# Patient Record
Sex: Male | Born: 1960 | Race: White | Hispanic: No | Marital: Married | Smoking: Current every day smoker
Health system: Southern US, Community
[De-identification: ages and names within clinical notes are randomized; demographics above are authoritative.]

## PROBLEM LIST (undated history)

## (undated) DIAGNOSIS — I10 Essential (primary) hypertension: Secondary | ICD-10-CM

---

## 2019-09-06 LAB — COLOGUARD: COLOGUARD: NEGATIVE

## 2020-10-07 ENCOUNTER — Ambulatory Visit
Admission: RE | Admit: 2020-10-07 | Discharge: 2020-10-07 | Disposition: A | Discharge status: Home / Self Care | Payer: BC Managed Care – PPO | Source: Ambulatory Visit | Attending: Internal Medicine | Admitting: Internal Medicine

## 2020-10-07 ENCOUNTER — Observation Stay
Admission: EM | Admit: 2020-10-07 | Discharge: 2020-10-08 | Disposition: A | Discharge status: Home / Self Care | Payer: BC Managed Care – PPO | Attending: Internal Medicine | Admitting: Internal Medicine

## 2020-10-07 ENCOUNTER — Other Ambulatory Visit: Payer: Self-pay

## 2020-10-07 ENCOUNTER — Observation Stay: Payer: BC Managed Care – PPO

## 2020-10-07 ENCOUNTER — Other Ambulatory Visit: Payer: Self-pay | Admitting: Internal Medicine

## 2020-10-07 ENCOUNTER — Emergency Department: Payer: BC Managed Care – PPO

## 2020-10-07 DIAGNOSIS — G459 Transient cerebral ischemic attack, unspecified: Secondary | ICD-10-CM | POA: Insufficient documentation

## 2020-10-07 DIAGNOSIS — Z720 Tobacco use: Secondary | ICD-10-CM

## 2020-10-07 DIAGNOSIS — R200 Anesthesia of skin: Secondary | ICD-10-CM | POA: Diagnosis present

## 2020-10-07 DIAGNOSIS — F17200 Nicotine dependence, unspecified, uncomplicated: Secondary | ICD-10-CM

## 2020-10-07 DIAGNOSIS — Z20822 Contact with and (suspected) exposure to covid-19: Secondary | ICD-10-CM | POA: Diagnosis not present

## 2020-10-07 DIAGNOSIS — I10 Essential (primary) hypertension: Secondary | ICD-10-CM | POA: Insufficient documentation

## 2020-10-07 DIAGNOSIS — I639 Cerebral infarction, unspecified: Principal | ICD-10-CM | POA: Diagnosis present

## 2020-10-07 DIAGNOSIS — E785 Hyperlipidemia, unspecified: Secondary | ICD-10-CM

## 2020-10-07 DIAGNOSIS — F17210 Nicotine dependence, cigarettes, uncomplicated: Secondary | ICD-10-CM | POA: Diagnosis not present

## 2020-10-07 HISTORY — DX: Essential (primary) hypertension: I10

## 2020-10-07 LAB — COMPREHENSIVE METABOLIC PANEL
ALT: 30 U/L (ref 0–44)
AST: 22 U/L (ref 15–41)
Albumin: 4.6 g/dL (ref 3.5–5.0)
Alkaline Phosphatase: 51 U/L (ref 38–126)
Anion gap: 11 (ref 5–15)
BUN: 13 mg/dL (ref 8–23)
CO2: 23 mmol/L (ref 22–32)
Calcium: 9.4 mg/dL (ref 8.9–10.3)
Chloride: 103 mmol/L (ref 98–111)
Creatinine, Ser: 1.13 mg/dL (ref 0.61–1.24)
GFR, Estimated: >60 mL/min (ref >60)
Glucose, Bld: 101 mg/dL — ABNORMAL HIGH (ref 70–99)
Potassium: 4 mmol/L (ref 3.5–5.1)
Sodium: 137 mmol/L (ref 135–145)
Total Bilirubin: 1.3 mg/dL — ABNORMAL HIGH (ref 0.3–1.2)
Total Protein: 7.9 g/dL (ref 6.5–8.1)

## 2020-10-07 LAB — CBC
HCT: 51.6 % (ref 39.0–52.0)
Hemoglobin: 17.5 g/dL — ABNORMAL HIGH (ref 13.0–17.0)
MCH: 31.8 pg (ref 26.0–34.0)
MCHC: 33.9 g/dL (ref 30.0–36.0)
MCV: 93.6 fL (ref 80.0–100.0)
Platelets: 249 10*3/uL (ref 150–400)
RBC: 5.51 MIL/uL (ref 4.22–5.81)
RDW: 11.9 % (ref 11.5–15.5)
WBC: 7.5 10*3/uL (ref 4.0–10.5)
nRBC: 0 % (ref 0.0–0.2)

## 2020-10-07 LAB — DIFFERENTIAL
Abs Immature Granulocytes: 0.02 10*3/uL (ref 0.00–0.07)
Basophils Absolute: 0.1 10*3/uL (ref 0.0–0.1)
Basophils Relative: 2 %
Eosinophils Absolute: 0.2 10*3/uL (ref 0.0–0.5)
Eosinophils Relative: 3 %
Immature Granulocytes: 0 %
Lymphocytes Relative: 30 %
Lymphs Abs: 2.2 10*3/uL (ref 0.7–4.0)
Monocytes Absolute: 0.6 10*3/uL (ref 0.1–1.0)
Monocytes Relative: 8 %
Neutro Abs: 4.3 10*3/uL (ref 1.7–7.7)
Neutrophils Relative %: 57 %

## 2020-10-07 LAB — RESP PANEL BY RT-PCR (FLU A&B, COVID) ARPGX2
Influenza A by PCR: NEGATIVE
Influenza B by PCR: NEGATIVE
SARS Coronavirus 2 by RT PCR: NEGATIVE

## 2020-10-07 LAB — APTT: aPTT: 29 seconds (ref 24–36)

## 2020-10-07 LAB — PROTIME-INR
INR: 0.9 (ref 0.8–1.2)
Prothrombin Time: 12.5 seconds (ref 11.4–15.2)

## 2020-10-07 MED ORDER — ACETAMINOPHEN 325 MG PO TABS: 650.0000 mg | ORAL_TABLET | ORAL | Status: DC | PRN

## 2020-10-07 MED ORDER — ATORVASTATIN CALCIUM 20 MG PO TABS
40.0000 mg | ORAL_TABLET | Freq: Every day | ORAL | Status: DC
  Administered 2020-10-07 – 2020-10-08 (×2): 40 mg via ORAL
  Filled 2020-10-07 (×2): qty 2

## 2020-10-07 MED ORDER — ACETAMINOPHEN 160 MG/5ML PO SOLN
650.0000 mg | ORAL | Status: DC | PRN
  Filled 2020-10-07: qty 20.3

## 2020-10-07 MED ORDER — ASPIRIN EC 81 MG PO TBEC
81.0000 mg | DELAYED_RELEASE_TABLET | Freq: Every day | ORAL | Status: DC
  Administered 2020-10-07 – 2020-10-08 (×2): 81 mg via ORAL
  Filled 2020-10-07 (×2): qty 1

## 2020-10-07 MED ORDER — STROKE: EARLY STAGES OF RECOVERY BOOK: Freq: Once | Status: AC

## 2020-10-07 MED ORDER — SENNOSIDES-DOCUSATE SODIUM 8.6-50 MG PO TABS: 1.0000 | ORAL_TABLET | Freq: Every evening | ORAL | Status: DC | PRN

## 2020-10-07 MED ORDER — NICOTINE 21 MG/24HR TD PT24
21.0000 mg | MEDICATED_PATCH | Freq: Every day | TRANSDERMAL | Status: DC
  Administered 2020-10-07 – 2020-10-08 (×2): 21 mg via TRANSDERMAL
  Filled 2020-10-07 (×2): qty 1

## 2020-10-07 MED ORDER — SODIUM CHLORIDE 0.9% FLUSH: 3.0000 mL | Freq: Once | INTRAVENOUS | Status: DC

## 2020-10-07 MED ORDER — ENOXAPARIN SODIUM 40 MG/0.4ML IJ SOSY
40.0000 mg | PREFILLED_SYRINGE | INTRAMUSCULAR | Status: DC
  Administered 2020-10-07: 20:00:00 40 mg via SUBCUTANEOUS
  Filled 2020-10-07: qty 0.4

## 2020-10-07 MED ORDER — ACETAMINOPHEN 650 MG RE SUPP: 650.0000 mg | RECTAL | Status: DC | PRN

## 2020-10-07 NOTE — ED Notes (Signed)
Zach RN aware of assigned bed 

## 2020-10-07 NOTE — ED Provider Notes (Signed)
[facility] Emergency Department Provider Note ____________________________________________   Event Date/Time   First MD Initiated Contact with Patient 10/07/20 1455     (approximate)  I have reviewed the triage vital signs and the nursing notes.   HISTORY  Chief Complaint Weakness    HPI Haley Roza is a 60 y.o. male reports he is quite healthy except is a smoker  He week ago on Thursday was at the doctor's office to have earwax removed when he was in the waiting room he noticed that he felt like his left arm went, numb and felt a little weak and then the left side of his face felt similar along with his tongue  He went home and he reports the next day when he got up all the numbness in his arm and everything else is gone away except his left side of his tongue just feels little numb and that the only symptom has had for a week.  Saw his doctor today and reported that, had a head CT and was told he had a stroke and to come to the emergency room  No headache no fevers or chills no chest pain.  He does not have a history of high blood pressure.  I asked him what his blood pressure here today and he reports when he was at his doctor's office earlier it was completely normal, I checked Dr.'s note and indeed his blood pressure was normotensive earlier   Past Medical History:  Diagnosis Date  . Hypertension     Patient Active Problem List   Diagnosis Date Noted  . CVA (cerebral vascular accident) (HCC) 10/07/2020    History reviewed. No pertinent surgical history.  Prior to Admission medications   Not on File    Allergies Patient has no known allergies.  History reviewed. No pertinent family history.  Social History Social History   Tobacco Use  . Smoking status: Current Every Day Smoker    Packs/day: 1.00    Types: Cigarettes  . Smokeless tobacco: Never Used    Review of Systems Constitutional: No fever/chills Eyes: No visual  changes. ENT: No sore throat.  Left-sided tongue feels little numb Cardiovascular: Denies chest pain. Respiratory: Denies shortness of breath. Gastrointestinal: No abdominal pain.   Genitourinary: Negative for dysuria. Musculoskeletal: Negative for back pain. Skin: Negative for rash. Neurological: Negative for headaches, areas of focal weakness or numbness except as noted in HPI.    ____________________________________________   PHYSICAL EXAM:  VITAL SIGNS: ED Triage Vitals  Enc Vitals Group     BP 10/07/20 1310 (!) 171/146     Pulse Rate 10/07/20 1310 94     Resp 10/07/20 1310 20     Temp 10/07/20 1309 97.7 F (36.5 C)     Temp Source 10/07/20 1309 Oral     SpO2 10/07/20 1310 97 %     Weight 10/07/20 1314 169 lb (76.7 kg)     Height 10/07/20 1314 5\' 9"  (1.753 m)     Head Circumference --      Peak Flow --      Pain Score 10/07/20 1313 0     Pain Loc --      Pain Edu? --      Excl. in GC? --     Constitutional: Alert and oriented. Well appearing and in no acute distress.  Ambulatory without difficulty. Eyes: Conjunctivae are normal. Head: Atraumatic. Nose: No congestion/rhinnorhea. Mouth/Throat: Mucous membranes are moist. Neck: No stridor.  No  carotid bruits noted. Cardiovascular: Normal rate, regular rhythm. Grossly normal heart sounds.  Good peripheral circulation. Respiratory: Normal respiratory effort.  No retractions. Lungs CTAB. Gastrointestinal: Soft and nontender. No distention. Musculoskeletal: No lower extremity tenderness nor edema. Neurologic:  Normal speech and language. No gross focal neurologic deficits are appreciated.  No noted focal neurologic deficits.  Strength is normal in all extremities.  Reports normal sensation all extremities.  Clear speech.  Normal orientation.  I did not discretely test sensation on his tongue however.  Extraocular movements are normal and no visual field deficits. Skin:  Skin is warm, dry and intact. No rash  noted. Psychiatric: Mood and affect are normal. Speech and behavior are normal.  ____________________________________________   LABS (all labs ordered are listed, but only abnormal results are displayed)  Labs Reviewed  CBC - Abnormal; Notable for the following components:      Result Value   Hemoglobin 17.5 (*)    All other components within normal limits  COMPREHENSIVE METABOLIC PANEL - Abnormal; Notable for the following components:   Glucose, Bld 101 (*)    Total Bilirubin 1.3 (*)    All other components within normal limits  RESP PANEL BY RT-PCR (FLU A&B, COVID) ARPGX2  DIFFERENTIAL  PROTIME-INR  APTT  HIV ANTIBODY (ROUTINE TESTING W REFLEX)  HEMOGLOBIN A1C  LIPID PANEL  MAGNESIUM  PHOSPHORUS  BASIC METABOLIC PANEL  CBC  TSH   ____________________________________________  EKG  ED ECG REPORT I, Sharyn Creamer, the attending physician, personally viewed and interpreted this ECG.  Date: 10/07/2020 EKG Time: 1320 Rate: 75 Rhythm: normal sinus rhythm QRS Axis: normal Intervals: normal ST/T Wave abnormalities: normal Narrative Interpretation: no evidence of acute ischemia  ____________________________________________  RADIOLOGY  CT HEAD WO CONTRAST  Result Date: 10/07/2020 CLINICAL DATA:  Pins and needle sensation down left arm last week. EXAM: CT HEAD WITHOUT CONTRAST TECHNIQUE: Contiguous axial images were obtained from the base of the skull through the vertex without intravenous contrast. COMPARISON:  None. FINDINGS: Brain: Chest age indeterminate infarct in the right parieto-occipital region with hypoattenuation and loss of gray-white differentiation.Remote appearing infarct in the right caudate and adjacent anterior limb of the internal capsule. No acute hemorrhage. No hydrocephalus. No midline shift. No visible extra-axial fluid collection. Basal cisterns are patent. Additional mild patchy white matter hypoattenuation, most likely related to chronic microvascular  ischemic disease. Vascular: No hyperdense vessel identified. Calcific atherosclerosis. Skull: No acute fracture. Sinuses/Orbits: Opacified posterior ethmoid air cells, partially imaged. Otherwise, visualized sinuses are clear. Other: Small right mastoid effusion. IMPRESSION: 1. Age indeterminate infarct in the right parieto-occipital region, potentially early subacute given the patient's reported symptoms. No substantial mass effect or acute hemorrhage. Recommend MRI to better evaluate for acute ischemia. 2. Remote appearing infarct in the right caudate and adjacent anterior limb of the internal capsule. 3. Small right mastoid effusion and opacified posterior ethmoid air cell. These results will be called to the ordering clinician or representative by the Radiologist Assistant, and communication documented in the PACS or [employer]. Electronically Signed   By: Feliberto Harts MD   On: 10/07/2020 11:55   US Carotid Duplex Bilateral  Result Date: 10/07/2020 CLINICAL DATA:  60 year old male with stroke. EXAM: BILATERAL CAROTID DUPLEX ULTRASOUND TECHNIQUE: Wallace Cullens scale imaging, color Doppler and duplex ultrasound were performed of bilateral carotid and vertebral arteries in the neck. COMPARISON:  None. FINDINGS: Criteria: Quantification of carotid stenosis is based on velocity parameters that correlate the residual internal carotid diameter with NASCET-based stenosis levels,  using the diameter of the distal internal carotid lumen as the denominator for stenosis measurement. The following velocity measurements were obtained: RIGHT ICA: Peak systolic velocity 79 cm/sec, End diastolic velocity 31 cm/sec CCA: Peak systolic velocity 52 cm/sec SYSTOLIC ICA/CCA RATIO:  0.5 ECA: Peak systolic velocity 79 cm/sec LEFT ICA: Peak systolic velocity 82 cm/sec, End diastolic velocity 34 cm/sec CCA: 7 cm/sec SYSTOLIC ICA/CCA RATIO:  1.2 ECA: 64 cm/sec RIGHT CAROTID ARTERY: Mild multifocal atherosclerotic plaque formation,  most prominent at the carotid bulb and bifurcation. No significant tortuosity. Normal low resistance waveforms. RIGHT VERTEBRAL ARTERY:  Antegrade flow. LEFT CAROTID ARTERY: Mild multifocal atherosclerotic plaque formation, most prominent in the distal common carotid artery and carotid bulb. No significant tortuosity. Normal low resistance waveforms. LEFT VERTEBRAL ARTERY:  Antegrade flow. Upper extremity non-invasive blood pressures: Not obtained. IMPRESSION: 1. Right carotid artery system: Less than 50% stenosis secondary to mild multifocal atherosclerotic plaque formation. 2. Left carotid artery system: Less than 50% stenosis secondary to mild multifocal atherosclerotic plaque formation. 3.  Vertebral artery system: Patent with antegrade flow bilaterally. Marliss Coots, MD [facility] Radiology Electronically Signed   By: Marliss Coots MD   On: 10/07/2020 16:26   Imaging reviewed, age-indeterminate infarct right parieto-occipital region.  No hemorrhage.  Additionally remote appearing infarcts also noted on the right.   ____________________________________________   PROCEDURES  Procedure(s) performed: None  Procedures  Critical Care performed: No  ____________________________________________   INITIAL IMPRESSION / ASSESSMENT AND PLAN / ED COURSE  Pertinent labs & imaging results that were available during my care of the patient were reviewed by me and considered in my medical decision making (see chart for details).   Patient presents for rather abrupt onset of acute neurologic symptoms a week ago, they have largely resolved except for a slight change in sensation of the left side of his tongue.  Outpatient physician referred him for concerns of an acute stroke.  His imaging does seem concerning for a subacute infarct at this time, symptoms and last known well 1 week ago.  Clearly not a tPA candidate  He is alert well oriented, patient tells  me that he would actually really like to be able to go to [place] tomorrow and potentially return for further work-up if that is possible.  I discussed with him, and I will have Dr. Selina Cooley our neurologist see him today for further treatment recommendations as well as coordination of appropriate work-up.  It is unclear to me at this point if he would actually benefit from inpatient evaluation given the longevity of symptoms.  Patient is now taking a baby aspirin daily.  Clinical Course as of 10/07/20 1728  Thu Oct 07, 2020  1728 ad[place]ission discussed with Dr. Tyson Babinski [MQ]    Clinical Course User Index [MQ] Sharyn Creamer, MD    ----------------------------------------- 4:02 PM on 10/07/2020 -----------------------------------------  Discussed case with Dr. Selina Cooley neurologist.  She recommends patient be admitted for further work-up, and has discussed this with him.  Patient is understanding agreeable plan for admission for work-up for apparent stroke, Dr. Selina Cooley to enter additional work-up and orders  Admission discussed with hospitalist Dr. Tyson Babinski ____________________________________________   FINAL CLINICAL IMPRESSION(S) / ED DIAGNOSES  Final diagnoses:  Cerebrovascular accident (CVA), unspecified mechanism (HCC)        Note:  This document was prepared using Dragon voice recognition software and may include unintentional dictation errors       Sharyn Creamer, MD 10/07/20 1728

## 2020-10-07 NOTE — H&P (Addendum)
[facility] History and Physical  Chick Cousins ONG:[id number] DOB: [birth year] DOA: 10/07/2020  Referring physician: ED  PCP: Gracelyn Nurse, MD   Patient is coming from: Home  Chief Complaint: Numbness of the tongue  HPI: Lonnie Larson is a 60 y.o. male with no documented past medical history, supposed to be starting antihypertensive medicine presented to hospital with complaints of left side of the tongue numbness.  Of note patient had gone to his primary care physician for some wax in his ears and then  had left upper extremity numbness including the left face.  That all resolved in 1 hour but then he persisted to have numbness over the tongue for the last 1 week.  He again saw his doctor today and had a CT scan done and was asked to come to the hospital.  Patient denies any focal weakness, nausea, headache, dizziness, blurry vision.  Patient denies any dizziness, lightheadedness or syncope.  Denies any pain, shortness of breath, cough, fever or chills.  Denies any urinary urgency, frequency or dysuria.  Patient has normal appetite and denies any changes in his bowel habits.  ED Course: In the ED, patient any focal neurological deficits.  EKG showed normal sinus rhythm.  CT scan done showed age-indeterminate infarct in the right parietal occipital region potentially early subacute.  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  Neurology was consulted who recommended work-up for stroke so hospitalist team was consulted for admission to the hospital.  Review of Systems:  All systems were reviewed and were negative unless otherwise mentioned in the HPI  Past Medical History:  Diagnosis Date  . Hypertension    History reviewed. No pertinent surgical history.  Social History:  reports that he has been smoking cigarettes. He has been smoking about 1.00 pack per day. He has never used smokeless tobacco. No history on file  for alcohol use and drug use.  No Known Allergies  History reviewed. No pertinent family history.   Prior to Admission medications   Not on File    Physical Exam: Vitals:   10/07/20 1309 10/07/20 1310 10/07/20 1314  BP:  (!) 171/146   Pulse:  94   Resp:  20   Temp: 97.7 F (36.5 C)    TempSrc: Oral    SpO2:  97%   Weight:   76.7 kg  Height:   5\' 9"  (1.753 m)   Wt Readings from Last 3 Encounters:  10/07/20 76.7 kg   Body mass index is 24.96 kg/m.  General:  Average built, not in obvious distress HENT: Normocephalic, pupils equally reacting to light and accommodation.  No scleral pallor or icterus noted. Oral mucosa is moist.  Chest:  Clear breath sounds.  Diminished breath sounds bilaterally. No crackles or wheezes.  CVS: S1 &S2 heard. No murmur.  Regular rate and rhythm. Abdomen: Soft, nontender, nondistended.  Bowel sounds are heard.  Liver is not palpable, no abdominal mass palpated Extremities: No cyanosis, clubbing or edema.  Peripheral pulses are palpable. Psych: Alert, awake and oriented, normal mood CNS:  No cranial nerve deficits.  Power equal in all extremities.   No cerebellar signs.   Skin: Warm and dry.  No rashes noted.  Labs on Admission:   CBC: Recent Labs  Lab 10/07/20 1314  WBC 7.5  NEUTROABS 4.3  HGB 17.5*  HCT 51.6  MCV 93.6  PLT 249    Basic Metabolic Panel: Recent Labs  Lab 10/07/20 1314  NA 137  K 4.0  CL 103  CO2 23  GLUCOSE 101*  BUN 13  CREATININE 1.13  CALCIUM 9.4    Liver Function Tests: Recent Labs  Lab 10/07/20 1314  AST 22  ALT 30  ALKPHOS 51  BILITOT 1.3*  PROT 7.9  ALBUMIN 4.6   No results for input(s): LIPASE, AMYLASE in the last 168 hours. No results for input(s): AMMONIA in the last 168 hours.  Cardiac Enzymes: No results for input(s): CKTOTAL, CKMB, CKMBINDEX, TROPONINI in the last 168 hours.  BNP (last 3 results) No results for input(s): BNP in the last 8760 hours.  ProBNP (last 3 results) No  results for input(s): PROBNP in the last 8760 hours.  CBG: No results for input(s): GLUCAP in the last 168 hours.  Lipase  No results found for: LIPASE   Urinalysis No results found for: COLORURINE, APPEARANCEUR, LABSPEC, PHURINE, GLUCOSEU, HGBUR, BILIRUBINUR, KETONESUR, PROTEINUR, UROBILINOGEN, NITRITE, LEUKOCYTESUR   Drugs of Abuse  No results found for: LABOPIA, COCAINSCRNUR, LABBENZ, AMPHETMU, THCU, LABBARB    Radiological Exams on Admission: CT HEAD WO CONTRAST  Result Date: 10/07/2020 CLINICAL DATA:  Pins and needle sensation down left arm last week. EXAM: CT HEAD WITHOUT CONTRAST TECHNIQUE: Contiguous axial images were obtained from the base of the skull through the vertex without intravenous contrast. COMPARISON:  None. FINDINGS: Brain: Chest age indeterminate infarct in the right parieto-occipital region with hypoattenuation and loss of gray-white differentiation.Remote appearing infarct in the right caudate and adjacent anterior limb of the internal capsule. No acute hemorrhage. No hydrocephalus. No midline shift. No visible extra-axial fluid collection. Basal cisterns are patent. Additional mild patchy white matter hypoattenuation, most likely related to chronic microvascular ischemic disease. Vascular: No hyperdense vessel identified. Calcific atherosclerosis. Skull: No acute fracture. Sinuses/Orbits: Opacified posterior ethmoid air cells, partially imaged. Otherwise, visualized sinuses are clear. Other: Small right mastoid effusion. IMPRESSION: 1. Age indeterminate infarct in the right parieto-occipital region, potentially early subacute given the patient's reported symptoms. No substantial mass effect or acute hemorrhage. Recommend MRI to better evaluate for acute ischemia. 2. Remote appearing infarct in the right caudate and adjacent anterior limb of the internal capsule. 3. Small right mastoid effusion and opacified posterior ethmoid air cell. These results will be called to the  ordering clinician or representative by the Radiologist Assistant, and communication documented in the PACS or [employer]. Electronically Signed   By: Feliberto Harts MD   On: 10/07/2020 11:55   US Carotid Duplex Bilateral  Result Date: 10/07/2020 CLINICAL DATA:  59 year old male with stroke. EXAM: BILATERAL CAROTID DUPLEX ULTRASOUND TECHNIQUE: Wallace Cullens scale imaging, color Doppler and duplex ultrasound were performed of bilateral carotid and vertebral arteries in the neck. COMPARISON:  None. FINDINGS: Criteria: Quantification of carotid stenosis is based on velocity parameters that correlate the residual internal carotid diameter with NASCET-based stenosis levels, using the diameter of the distal internal carotid lumen as the denominator for stenosis measurement. The following velocity measurements were obtained: RIGHT ICA: Peak systolic velocity 79 cm/sec, End diastolic velocity 31 cm/sec CCA: Peak systolic velocity 52 cm/sec SYSTOLIC ICA/CCA RATIO:  0.5 ECA: Peak systolic velocity 79 cm/sec LEFT ICA: Peak systolic velocity 82 cm/sec, End diastolic velocity 34 cm/sec CCA: 7 cm/sec SYSTOLIC ICA/CCA RATIO:  1.2 ECA: 64 cm/sec RIGHT CAROTID ARTERY: Mild multifocal atherosclerotic plaque formation, most prominent at the carotid bulb and bifurcation. No significant tortuosity. Normal low resistance waveforms. RIGHT VERTEBRAL ARTERY:  Antegrade flow. LEFT CAROTID ARTERY: Mild multifocal  atherosclerotic plaque formation, most prominent in the distal common carotid artery and carotid bulb. No significant tortuosity. Normal low resistance waveforms. LEFT VERTEBRAL ARTERY:  Antegrade flow. Upper extremity non-invasive blood pressures: Not obtained. IMPRESSION: 1. Right carotid artery system: Less than 50% stenosis secondary to mild multifocal atherosclerotic plaque formation. 2. Left carotid artery system: Less than 50% stenosis secondary to mild multifocal atherosclerotic plaque formation. 3.  Vertebral artery  system: Patent with antegrade flow bilaterally. Marliss Coots, MD [facility]iology Electronically Signed   By: Marliss Coots MD   On: 10/07/2020 16:26    EKG: Personally reviewed by me which shows normal sinus rhythm  Assessment/Plan Principal Problem:   CVA (cerebral vascular accident) (HCC)  Subacute CVA with residual tongue numbness.  Neurology was improving the ED who recommended work-up for stroke including carotid duplex ultrasound and echocardiogram.  Patient did have a CT scan which showed subacute infarct.  Check hemoglobin A1c, fasting lipid profile.  Will obtain MRI of the brain for further definition.  Follow neurology recommendations.  PT OT speech therapy.  Telemetry monitoring.  Chronic smoker.  Patient was extensively counseled about it we will put the patient on nicotine patch while in the hospital.   DVT Prophylaxis: Lovenox subcu  Consultant: Neurology  Code Status: Full code  Microbiology none  Antibiotics: None  Family Communication:  Patients' condition and plan of care including tests being ordered have been discussed with the patient and patient's spouse at bedside who indicate understanding and agree with the plan.   Status is: Observation  The patient remains OBS appropriate and will d/c before 2 midnights.  Dispo: The patient is from: Home              Anticipated d/c is to: Home               Severity of Illness: The appropriate patient status for this patient is OBSERVATION. Observation status is judged to be reasonable and necessary in order to provide the required intensity of service to ensure the patient's safety. The patient's presenting symptoms, physical exam findings, and initial radiographic and laboratory data in the context of their medical condition is felt to place them at decreased risk for further clinical deterioration. Furthermore, it is anticipated that the patient will be medically  stable for discharge from the hospital within 2 midnights of admission. The following factors support the patient status of observation. Signed,  Joycelyn Das, MD [facility] 10/07/2020

## 2020-10-07 NOTE — ED Triage Notes (Signed)
Pt reports last Thursday he starting having tingling in left arm and tongue. Had CT today, was called by Dr and told to come to ER because he had a stroke.  States right now he is only having tingling in tongue  Ambulatory, oriented, clear speech

## 2020-10-07 NOTE — ED Notes (Signed)
Hospitalist at bedside 

## 2020-10-07 NOTE — ED Notes (Signed)
US at bedside

## 2020-10-08 ENCOUNTER — Observation Stay (HOSPITAL_BASED_OUTPATIENT_CLINIC_OR_DEPARTMENT_OTHER)
Admit: 2020-10-08 | Discharge: 2020-10-08 | Disposition: A | Discharge status: Home / Self Care | Payer: BC Managed Care – PPO | Attending: Internal Medicine | Admitting: Internal Medicine

## 2020-10-08 DIAGNOSIS — Z720 Tobacco use: Secondary | ICD-10-CM

## 2020-10-08 DIAGNOSIS — E782 Mixed hyperlipidemia: Secondary | ICD-10-CM | POA: Diagnosis not present

## 2020-10-08 DIAGNOSIS — E785 Hyperlipidemia, unspecified: Secondary | ICD-10-CM

## 2020-10-08 DIAGNOSIS — I639 Cerebral infarction, unspecified: Secondary | ICD-10-CM | POA: Diagnosis not present

## 2020-10-08 DIAGNOSIS — I6389 Other cerebral infarction: Secondary | ICD-10-CM | POA: Diagnosis not present

## 2020-10-08 LAB — BASIC METABOLIC PANEL
Anion gap: 9 (ref 5–15)
BUN: 15 mg/dL (ref 8–23)
CO2: 25 mmol/L (ref 22–32)
Calcium: 9 mg/dL (ref 8.9–10.3)
Chloride: 103 mmol/L (ref 98–111)
Creatinine, Ser: 0.91 mg/dL (ref 0.61–1.24)
GFR, Estimated: >60 mL/min (ref >60)
Glucose, Bld: 89 mg/dL (ref 70–99)
Potassium: 3.8 mmol/L (ref 3.5–5.1)
Sodium: 137 mmol/L (ref 135–145)

## 2020-10-08 LAB — CBC
HCT: 48.4 % (ref 39.0–52.0)
Hemoglobin: 16.6 g/dL (ref 13.0–17.0)
MCH: 32.4 pg (ref 26.0–34.0)
MCHC: 34.3 g/dL (ref 30.0–36.0)
MCV: 94.5 fL (ref 80.0–100.0)
Platelets: 236 10*3/uL (ref 150–400)
RBC: 5.12 MIL/uL (ref 4.22–5.81)
RDW: 12 % (ref 11.5–15.5)
WBC: 6.6 10*3/uL (ref 4.0–10.5)
nRBC: 0 % (ref 0.0–0.2)

## 2020-10-08 LAB — ECHOCARDIOGRAM COMPLETE
AR max vel: 2.72 cm2
AV Area VTI: 3.08 cm2
AV Area mean vel: 2.99 cm2
AV Mean grad: 4 mmHg
AV Peak grad: 9.1 mmHg
Ao pk vel: 1.51 m/s
Area-P 1/2: 4.21 cm2
Height: 69 in
MV VTI: 3.48 cm2
S' Lateral: 3.1 cm
Weight: 2704 oz

## 2020-10-08 LAB — HEMOGLOBIN A1C
Hgb A1c MFr Bld: 5.4 % (ref 4.8–5.6)
Mean Plasma Glucose: 108 mg/dL

## 2020-10-08 LAB — LIPID PANEL
Cholesterol: 277 mg/dL — ABNORMAL HIGH (ref 0–200)
HDL: 47 mg/dL (ref >40)
LDL Cholesterol: 210 mg/dL — ABNORMAL HIGH (ref 0–99)
Total CHOL/HDL Ratio: 5.9 RATIO
Triglycerides: 100 mg/dL (ref <150)
VLDL: 20 mg/dL (ref 0–40)

## 2020-10-08 LAB — TSH: TSH: 3.255 u[IU]/mL (ref 0.350–4.500)

## 2020-10-08 LAB — HIV ANTIBODY (ROUTINE TESTING W REFLEX): HIV Screen 4th Generation wRfx: NONREACTIVE

## 2020-10-08 LAB — PHOSPHORUS: Phosphorus: 4.4 mg/dL (ref 2.5–4.6)

## 2020-10-08 LAB — MAGNESIUM: Magnesium: 2.3 mg/dL (ref 1.7–2.4)

## 2020-10-08 MED ORDER — CLOPIDOGREL BISULFATE 75 MG PO TABS: 75.0000 mg | ORAL_TABLET | Freq: Every day | ORAL | 0 refills | Status: AC

## 2020-10-08 MED ORDER — ATORVASTATIN CALCIUM 20 MG PO TABS: 80.0000 mg | ORAL_TABLET | Freq: Every day | ORAL | Status: DC

## 2020-10-08 MED ORDER — ATORVASTATIN CALCIUM 80 MG PO TABS: 80.0000 mg | ORAL_TABLET | Freq: Every day | ORAL | 11 refills | Status: AC

## 2020-10-08 MED ORDER — ASPIRIN 81 MG PO TBEC: 81.0000 mg | DELAYED_RELEASE_TABLET | Freq: Every day | ORAL | 11 refills | Status: AC

## 2020-10-08 MED ORDER — CLOPIDOGREL BISULFATE 75 MG PO TABS
75.0000 mg | ORAL_TABLET | Freq: Every day | ORAL | Status: DC
  Administered 2020-10-08: 13:00:00 75 mg via ORAL
  Filled 2020-10-08: qty 1

## 2020-10-08 NOTE — Evaluation (Signed)
Occupational Therapy Evaluation Patient Details Name: Lonnie Larson MRN: [id number] DOB: [birth year] Today's Date: 10/08/2020    History of Present Illness Pt is 60 y/o male s/p new acute CVA.   Clinical Impression   Pt reports living at home with wife independently at baseline. Pt does not use AD at baseline. He shares IADL tasks with wife and he does the driving. Pt reports stress with getting ready for a trip prior to symptoms of numbness in mouth and on L UE ( has resolved). Pt demonstrates self care tasks and mobility without LOB. OT performed vision assessment as pt report some visual change over the last few weeks. No deficits noted at this time. OT discussed secondary stroke risk education to with and caregiver. All needs within reach. No need for skilled acute OT at this time. OT to SIGN OFF. Thank you for this referral.    Follow Up Recommendations  No OT follow up    Equipment Recommendations  None recommended by OT       Precautions / Restrictions Precautions Precautions: None      Mobility Bed Mobility Overal bed mobility: Independent                  Transfers Overall transfer level: Independent                    Balance Overall balance assessment: Independent                                         ADL either performed or assessed with clinical judgement   ADL Overall ADL's : Independent                                             Vision Patient Visual Report: Other (comment);Blurring of vision ("over the last few weeks") Vision Assessment?: Yes Eye Alignment: Within Functional Limits Ocular Range of Motion: Within Functional Limits Alignment/Gaze Preference: Within Defined Limits Tracking/Visual Pursuits: Able to track stimulus in all quads without difficulty Saccades: Within functional limits Convergence: Within functional limits            Pertinent Vitals/Pain Pain Assessment: No/denies  pain     Hand Dominance Left   Extremity/Trunk Assessment Upper Extremity Assessment Upper Extremity Assessment: Overall WFL for tasks assessed   Lower Extremity Assessment Lower Extremity Assessment: Overall WFL for tasks assessed       Communication Communication Communication: No difficulties   Cognition Arousal/Alertness: Awake/alert Behavior During Therapy: WFL for tasks assessed/performed Overall Cognitive Status: Within Functional Limits for tasks assessed                                                Home Living Family/patient expects to be discharged to:: Private residence Living Arrangements: Spouse/significant other Available Help at Discharge: Family;Available 24 hours/day Type of Home: House Home Access: Stairs to enter [employer] of Steps: 3-4 Entrance Stairs-Rails: Right;Left Home Layout: One level     Bathroom Shower/Tub: Walk-in shower         Home Equipment: None          Prior Functioning/Environment Level of Independence: Independent  Comments: Pt reports I with all aspects of self care and mobility. He lives with wife. Still drives.                 OT Goals(Current goals can be found in the care plan section) Acute Rehab OT Goals Patient Stated Goal: to go home OT Goal Formulation: With patient/family Time For Goal Achievement: 10/08/20 Potential to Achieve Goals: Good  OT Frequency:                AM-PAC OT "6 Clicks" Daily Activity     Outcome Measure Help from another person eating meals?: None Help from another person taking care of personal grooming?: None Help from another person toileting, which includes using toliet, bedpan, or urinal?: None Help from another person bathing (including washing, rinsing, drying)?: None Help from another person to put on and taking off regular upper body clothing?: None Help from another person to put on and taking off regular lower body  clothing?: None 6 Click Score: 24   End of Session Nurse Communication: Mobility status  Activity Tolerance: Patient tolerated treatment well Patient left: in bed;with call bell/phone within reach;with family/visitor present                   Time: 8099-8338 OT Time Calculation (min): 18 min Charges:  OT General Charges $OT Visit: 1 Visit OT Evaluation $OT Eval Low Complexity: 1 Low OT Treatments $Therapeutic Activity: 8-22 mins  Jackquline Denmark, MS, OTR/L , CBIS ascom [phone]  10/08/20, 12:36 PM

## 2020-10-08 NOTE — Progress Notes (Signed)
PT Cancellation Note  Patient Details Name: Lonnie Larson MRN: [id number] DOB: [birth year]   Cancelled Treatment:    Reason Eval/Treat Not Completed: PT screened, no needs identified, will sign off.  Pt resting in bed upon PT arrival; pt reporting only residual symptom is L sided tongue numbness.  Pt independent with bed mobility, transfers, and ambulation 100 feet in room (no AD); no balance deficits noted with functional activities.  Intact B LE strength, light touch, tone, heel to shin coordination, and proprioception.  Pt reporting no mobility, strength, or balance concerns.  PT screen performed--no needs identified; will sign off.  Hendricks Limes, PT 10/08/20, 10:49 AM

## 2020-10-08 NOTE — Discharge Summary (Signed)
Physician Discharge Summary   Lonnie Larson QJF:[id number] DOB: 22-Jun-1960 DOA: 10/07/2020  PCP: Gracelyn Nurse, MD  Admit date: 10/07/2020 Discharge date:  10/08/2020  Admitted From: home Disposition:  home Discharging physician: Lewie Chamber, MD  Recommendations for Outpatient Follow-up:  1. Follow up with neurology 2. Patient discharged on Asa + Plavix x 21 days followed by Jonne Ply monotherapy 3. Patient started on Lipitor 80 mg at discharge; baseline LFTs obtained  4. Patient encouraged to quit smoking, continue encouragement  5. A1c pending at time of discharge    Patient discharged to home in Discharge Condition: stable Risk of unplanned readmission score:    CODE STATUS: Full Diet recommendation:  Diet Orders (From admission, onward)    Start     Ordered   10/08/20 0000  Diet - low sodium heart healthy        10/08/20 1307   10/07/20 1705  Diet Heart Room service appropriate? Yes; Fluid consistency: Thin  Diet effective now       Question Answer Comment  Room service appropriate? Yes   Fluid consistency: Thin      10/07/20 1704          Hospital Course:  Lonnie Larson is a 60 year old male with PMH tobacco use, hypertension (on no home meds) who presented to the hospital with left-sided tongue numbness.  He also had a transient complaint of left arm numbness/tingling that had resolved prior to admission. He was admitted and underwent stroke work-up. CT head showed an age-indeterminate infarct in the right parieto-occipital region as well as remote appearing infarcts involving the right caudate and adjacent anterior limb of the internal capsule.  MRI brain then obtained and showed acute or early subacute infarct involving right occipital lobe with associated edema without mass-effect.  Remote infarcts again seen in the bilateral caudate heads, anterior limb of the right internal capsule, left frontal and right parietal cortex, and right corona radiata.  Carotid  ultrasound showed less than 50% stenosis bilaterally.  Echo was obtained and had no obvious intracardiac clots.  EF 60 to 65%, grade 1 diastolic dysfunction.  No RWMA.  He was evaluated by neurology during hospitalization as well. He was initiated on aspirin and Plavix for 21 days followed by aspirin monotherapy thereafter.  His LDL was 210 and he was discharged on Lipitor 80 mg daily. His A1c was still pending at time of discharge and should be followed up on as well. Neurology follow-up is being arranged at time of discharge as well.  He was strongly encouraged to quit smoking as well which he states he is motivated to try and pursue given his stroke.   The patient's chronic medical conditions were treated accordingly per the patient's home medication regimen except as noted.  On day of discharge, patient was felt deemed stable for discharge. Patient/family member advised to call PCP or come back to ER if needed.   Principal Diagnosis: CVA (cerebral vascular accident) [facility])  Discharge Diagnoses: Active Hospital Problems   Diagnosis Date Noted  . CVA (cerebral vascular accident) (HCC) 10/07/2020    Resolved Hospital Problems  No resolved problems to display.    Discharge Instructions    Ambulatory referral to Neurology   Complete by: As directed    Stroke f/u 4-6 wks   Diet - low sodium heart healthy   Complete by: As directed    Increase activity slowly   Complete by: As directed      Allergies as of 10/08/2020  No Known Allergies     Medication List    TAKE these medications   aspirin 81 MG EC tablet Take 1 tablet (81 mg total) by mouth daily. Swallow whole. Start taking on: Oct 09, 2020   atorvastatin 80 MG tablet Commonly known as: Lipitor Take 1 tablet (80 mg total) by mouth daily.   clopidogrel 75 MG tablet Commonly known as: PLAVIX Take 1 tablet (75 mg total) by mouth daily. Start taking on: Oct 09, 2020       Follow-up Information    Gracelyn Nurse,  MD. Schedule an appointment as soon as possible for a visit in 2 week(s).   Specialty: Internal Medicine Contact information: [address] [place] [address] [phone]              No Known Allergies  Consultations: Neurology  Discharge Exam: BP 133/86 (BP Location: Left Arm)   Pulse 68   Temp 98 F (36.7 C)   Resp 18   Ht  (1.753 m)   Wt 76.7 kg   SpO2 97%   BMI 24.96 kg/m  General appearance: alert, cooperative and no distress Head: Normocephalic, without obvious abnormality, atraumatic Eyes: EOMI, PERRLA Throat: no tongue deviation; symmetric elevation of uvula Lungs: clear to auscultation bilaterally Heart: regular rate and rhythm and S1, S2 normal Abdomen: normal findings: bowel sounds normal and soft, non-tender Extremities: no edema Skin: mobility and turgor normal Neurologic: Strength and sensation intact and normal bilaterally.  Patient endorses subjective left tongue paresthesia.  The results of significant diagnostics from this hospitalization (including imaging, microbiology, ancillary and laboratory) are listed below for reference.   Microbiology: Recent Results (from the past 240 hour(s))  Resp Panel by RT-PCR (Flu A&B, Covid) Nasopharyngeal Swab     Status: None   Collection Time: 10/07/20  4:30 PM   Specimen: Nasopharyngeal Swab; Nasopharyngeal(NP) swabs in vial transport medium  Result Value Ref Range Status   SARS Coronavirus 2 by RT PCR NEGATIVE NEGATIVE Final    Comment: (NOTE) SARS-CoV-2 target nucleic acids are NOT DETECTED.  The SARS-CoV-2 RNA is generally detectable in upper respiratory specimens during the acute phase of infection. The lowest concentration of SARS-CoV-2 viral copies this assay can detect is 138 copies/mL. A negative result does not preclude SARS-Cov-2 infection and should not be used as the sole basis for treatment or other patient management decisions. A negative result may occur with  improper  specimen collection/handling, submission of specimen other than nasopharyngeal swab, presence of viral mutation(s) within the areas targeted by this assay, and inadequate number of viral copies(<138 copies/mL). A negative result must be combined with clinical observations, patient history, and epidemiological information. The expected result is Negative.  Fact Sheet for Patients:  BloggerCourse.com  Fact Sheet for Healthcare Providers:  SeriousBroker.it  This test is no t yet approved or cleared by the [place] FDA and  has been authorized for detection and/or diagnosis of SARS-CoV-2 by FDA under an Emergency Use Authorization (EUA). This EUA will remain  in effect (meaning this test can be used) for the duration of the COVID-19 declaration under Section 564(b)(1) of the Act, 21 U.S.C.section 360bbb-3(b)(1), unless the authorization is terminated  or revoked sooner.       Influenza A by PCR NEGATIVE NEGATIVE Final   Influenza B by PCR NEGATIVE NEGATIVE Final    Comment: (NOTE) The Xpert Xpress SARS-CoV-2/FLU/RSV plus assay is intended as an aid in the diagnosis of influenza from Nasopharyngeal swab specimens  and should not be used as a sole basis for treatment. Nasal washings and aspirates are unacceptable for Xpert Xpress SARS-CoV-2/FLU/RSV testing.  Fact Sheet for Patients: BloggerCourse.comhttps://www.fda.gov/media/152166/download  Fact Sheet for Healthcare Providers: SeriousBroker.ithttps://www.fda.gov/media/152162/download  This test is not yet approved or cleared by the [place] FDA and has been authorized for detection and/or diagnosis of SARS-CoV-2 by FDA under an Emergency Use Authorization (EUA). This EUA will remain in effect (meaning this test can be used) for the duration of the COVID-19 declaration under Section 564(b)(1) of the Act, 21 U.S.C. section 360bbb-3(b)(1), unless the authorization is terminated or revoked.  Performed at  [facility], [address] [place], [place] [id number]      Labs: BNP (last 3 results) No results for input(s): BNP in the last 8760 hours. Basic Metabolic Panel: Recent Labs  Lab 10/07/20 1314 10/08/20 0531  NA 137 137  K 4.0 3.8  CL 103 103  CO2 23 25  GLUCOSE 101* 89  BUN 13 15  CREATININE 1.13 0.91  CALCIUM 9.4 9.0  MG  --  2.3  PHOS  --  4.4   Liver Function Tests: Recent Labs  Lab 10/07/20 1314  AST 22  ALT 30  ALKPHOS 51  BILITOT 1.3*  PROT 7.9  ALBUMIN 4.6   No results for input(s): LIPASE, AMYLASE in the last 168 hours. No results for input(s): AMMONIA in the last 168 hours. CBC: Recent Labs  Lab 10/07/20 1314 10/08/20 0531  WBC 7.5 6.6  NEUTROABS 4.3  --   HGB 17.5* 16.6  HCT 51.6 48.4  MCV 93.6 94.5  PLT 249 236   Cardiac Enzymes: No results for input(s): CKTOTAL, CKMB, CKMBINDEX, TROPONINI in the last 168 hours. BNP: Invalid input(s): POCBNP CBG: No results for input(s): GLUCAP in the last 168 hours. D-Dimer No results for input(s): DDIMER in the last 72 hours. Hgb A1c No results for input(s): HGBA1C in the last 72 hours. Lipid Profile Recent Labs    10/08/20 0531  CHOL 277*  HDL 47  LDLCALC 210*  TRIG 100  CHOLHDL 5.9   Thyroid function studies Recent Labs    10/08/20 0531  TSH 3.255   Anemia work up No results for input(s): VITAMINB12, FOLATE, FERRITIN, TIBC, IRON, RETICCTPCT in the last 72 hours. Urinalysis No results found for: COLORURINE, APPEARANCEUR, LABSPEC, PHURINE, GLUCOSEU, HGBUR, BILIRUBINUR, KETONESUR, PROTEINUR, UROBILINOGEN, NITRITE, LEUKOCYTESUR Sepsis Labs Invalid input(s): PROCALCITONIN,  WBC,  LACTICIDVEN Microbiology Recent Results (from the past 240 hour(s))  Resp Panel by RT-PCR (Flu A&B, Covid) Nasopharyngeal Swab     Status: None   Collection Time: 10/07/20  4:30 PM   Specimen: Nasopharyngeal Swab; Nasopharyngeal(NP) swabs in vial transport medium  Result Value Ref Range Status    SARS Coronavirus 2 by RT PCR NEGATIVE NEGATIVE Final    Comment: (NOTE) SARS-CoV-2 target nucleic acids are NOT DETECTED.  The SARS-CoV-2 RNA is generally detectable in upper respiratory specimens during the acute phase of infection. The lowest concentration of SARS-CoV-2 viral copies this assay can detect is 138 copies/mL. A negative result does not preclude SARS-Cov-2 infection and should not be used as the sole basis for treatment or other patient management decisions. A negative result may occur with  improper specimen collection/handling, submission of specimen other than nasopharyngeal swab, presence of viral mutation(s) within the areas targeted by this assay, and inadequate number of viral copies(<138 copies/mL). A negative result must be combined with clinical observations, patient history, and epidemiological information. The expected result is  Negative.  Fact Sheet for Patients:  BloggerCourse.com  Fact Sheet for Healthcare Providers:  SeriousBroker.it  This test is no t yet approved or cleared by the [place] FDA and  has been authorized for detection and/or diagnosis of SARS-CoV-2 by FDA under an Emergency Use Authorization (EUA). This EUA will remain  in effect (meaning this test can be used) for the duration of the COVID-19 declaration under Section 564(b)(1) of the Act, 21 U.S.C.section 360bbb-3(b)(1), unless the authorization is terminated  or revoked sooner.       Influenza A by PCR NEGATIVE NEGATIVE Final   Influenza B by PCR NEGATIVE NEGATIVE Final    Comment: (NOTE) The Xpert Xpress SARS-CoV-2/FLU/RSV plus assay is intended as an aid in the diagnosis of influenza from Nasopharyngeal swab specimens and should not be used as a sole basis for treatment. Nasal washings and aspirates are unacceptable for Xpert Xpress SARS-CoV-2/FLU/RSV testing.  Fact Sheet for  Patients: BloggerCourse.com  Fact Sheet for Healthcare Providers: SeriousBroker.it  This test is not yet approved or cleared by the [place] FDA and has been authorized for detection and/or diagnosis of SARS-CoV-2 by FDA under an Emergency Use Authorization (EUA). This EUA will remain in effect (meaning this test can be used) for the duration of the COVID-19 declaration under Section 564(b)(1) of the Act, 21 U.S.C. section 360bbb-3(b)(1), unless the authorization is terminated or revoked.  Performed at [facility], [address] [place], [place] [address]     Procedures/Studies: CT HEAD WO CONTRAST  Result Date: 10/07/2020 CLINICAL DATA:  Pins and needle sensation down left arm last week. EXAM: CT HEAD WITHOUT CONTRAST TECHNIQUE: Contiguous axial images were obtained from the base of the skull through the vertex without intravenous contrast. COMPARISON:  None. FINDINGS: Brain: Chest age indeterminate infarct in the right parieto-occipital region with hypoattenuation and loss of gray-white differentiation.Remote appearing infarct in the right caudate and adjacent anterior limb of the internal capsule. No acute hemorrhage. No hydrocephalus. No midline shift. No visible extra-axial fluid collection. Basal cisterns are patent. Additional mild patchy white matter hypoattenuation, most likely related to chronic microvascular ischemic disease. Vascular: No hyperdense vessel identified. Calcific atherosclerosis. Skull: No acute fracture. Sinuses/Orbits: Opacified posterior ethmoid air cells, partially imaged. Otherwise, visualized sinuses are clear. Other: Small right mastoid effusion. IMPRESSION: 1. Age indeterminate infarct in the right parieto-occipital region, potentially early subacute given the patient's reported symptoms. No substantial mass effect or acute hemorrhage. Recommend MRI to better evaluate for acute ischemia. 2.  Remote appearing infarct in the right caudate and adjacent anterior limb of the internal capsule. 3. Small right mastoid effusion and opacified posterior ethmoid air cell. These results will be called to the ordering clinician or representative by the Radiologist Assistant, and communication documented in the PACS or [employer]. Electronically Signed   By: Feliberto Harts MD   On: 10/07/2020 11:55   MR BRAIN WO CONTRAST  Result Date: 10/07/2020 CLINICAL DATA:  Stroke follow-up. EXAM: MRI HEAD WITHOUT CONTRAST TECHNIQUE: Multiplanar, multiecho pulse sequences of the brain and surrounding structures were obtained without intravenous contrast. COMPARISON:  Same day CT head. FINDINGS: Brain: Restricted diffusion in the right occipital lobe (series 5 and 6, image 20), compatible with acute or early subacute infarct. Associated edema without mass effect. Small remote infarcts in bilateral caudate heads, anterior limb of the right internal capsule, left frontal and right parietal cortex, and right corona radiata. Additional mild to moderate scattered T2/FLAIR hyperintensities within the white matter and pons,  likely the sequela of chronic microvascular ischemic disease. No acute hemorrhage, hydrocephalus, mass lesion, or extra-axial fluid collection. No midline shift. Basal cisterns are patent. Vascular: Major arterial flow voids are maintained at the skull base. Incidental left cerebellar developmental venous anomaly seen on susceptibility weighted imaging. Skull and upper cervical spine: Normal marrow signal. Sinuses/Orbits: Opacified posterior ethmoid air cells. Otherwise, clear sinuses. Unremarkable orbits. Other: Moderate right mastoid effusion. IMPRESSION: 1. Acute or early subacute infarct in the right occipital lobe. Associated edema without mass effect. 2. Small remote infarcts in bilateral caudate heads, anterior limb of the right internal capsule, left frontal and right parietal cortex, and right  coarona radiata. 3. Mild to moderate chronic microvascular ischemic disease. 4. Moderate right mastoid effusion. Electronically Signed   By: Feliberto Harts MD   On: 10/07/2020 18:37   US Carotid Duplex Bilateral  Result Date: 10/07/2020 CLINICAL DATA:  60 year old male with stroke. EXAM: BILATERAL CAROTID DUPLEX ULTRASOUND TECHNIQUE: Wallace Cullens scale imaging, color Doppler and duplex ultrasound were performed of bilateral carotid and vertebral arteries in the neck. COMPARISON:  None. FINDINGS: Criteria: Quantification of carotid stenosis is based on velocity parameters that correlate the residual internal carotid diameter with NASCET-based stenosis levels, using the diameter of the distal internal carotid lumen as the denominator for stenosis measurement. The following velocity measurements were obtained: RIGHT ICA: Peak systolic velocity 79 cm/sec, End diastolic velocity 31 cm/sec CCA: Peak systolic velocity 52 cm/sec SYSTOLIC ICA/CCA RATIO:  0.5 ECA: Peak systolic velocity 79 cm/sec LEFT ICA: Peak systolic velocity 82 cm/sec, End diastolic velocity 34 cm/sec CCA: 7 cm/sec SYSTOLIC ICA/CCA RATIO:  1.2 ECA: 64 cm/sec RIGHT CAROTID ARTERY: Mild multifocal atherosclerotic plaque formation, most prominent at the carotid bulb and bifurcation. No significant tortuosity. Normal low resistance waveforms. RIGHT VERTEBRAL ARTERY:  Antegrade flow. LEFT CAROTID ARTERY: Mild multifocal atherosclerotic plaque formation, most prominent in the distal common carotid artery and carotid bulb. No significant tortuosity. Normal low resistance waveforms. LEFT VERTEBRAL ARTERY:  Antegrade flow. Upper extremity non-invasive blood pressures: Not obtained. IMPRESSION: 1. Right carotid artery system: Less than 50% stenosis secondary to mild multifocal atherosclerotic plaque formation. 2. Left carotid artery system: Less than 50% stenosis secondary to mild multifocal atherosclerotic plaque formation. 3.  Vertebral artery system: Patent with  antegrade flow bilaterally. Marliss Coots, MD [facility] Electronically Signed   By: Marliss Coots MD   On: 10/07/2020 16:26   ECHOCARDIOGRAM COMPLETE  Result Date: 10/08/2020    ECHOCARDIOGRAM REPORT   Patient Name:   Lonnie Larson Date of Exam: 10/08/2020 Medical Rec #:  [id number]        Height:       69.0 in Accession #:    [id number]       Weight:       169.0 lb Date of Birth:  [birth year]        BSA:          1.923 m Patient Age:    60 years         BP:           133/86 mmHg Patient Gender: M                HR:           71 bpm. Exam Location:  [facility] Procedure: 2D Echo, Color Doppler and Cardiac Doppler Indications:     I63.9 Stroke  History:         Patient has no prior  history of Echocardiogram examinations.                  Risk Factors:Hypertension.  Sonographer:     Humphrey Rolls RDCS (AE) Referring Phys:  [id number] [facility] Diagnosing Phys: Julien Nordmann MD IMPRESSIONS  1. Left ventricular ejection fraction, by estimation, is 60 to 65%. The left ventricle has normal function. The left ventricle has no regional wall motion abnormalities. Left ventricular diastolic parameters are consistent with Grade I diastolic dysfunction (impaired relaxation).  2. Right ventricular systolic function is normal. The right ventricular size is normal. Tricuspid regurgitation signal is inadequate for assessing PA pressure. FINDINGS  Left Ventricle: Left ventricular ejection fraction, by estimation, is 60 to 65%. The left ventricle has normal function. The left ventricle has no regional wall motion abnormalities. The left ventricular internal cavity size was normal in size. There is  no left ventricular hypertrophy. Left ventricular diastolic parameters are consistent with Grade I diastolic dysfunction (impaired relaxation). Right Ventricle: The right ventricular size is normal. No increase in right ventricular wall thickness. Right ventricular systolic function  is normal. Tricuspid regurgitation signal is inadequate for assessing PA pressure. Left Atrium: Left atrial size was normal in size. Right Atrium: Right atrial size was normal in size. Pericardium: There is no evidence of pericardial effusion. Mitral Valve: The mitral valve is normal in structure. Trivial mitral valve regurgitation. No evidence of mitral valve stenosis. MV peak gradient, 3.3 mmHg. The mean mitral valve gradient is 1.0 mmHg. Tricuspid Valve: The tricuspid valve is normal in structure. Tricuspid valve regurgitation is not demonstrated. No evidence of tricuspid stenosis. Aortic Valve: The aortic valve is normal in structure. Aortic valve regurgitation is mild. No aortic stenosis is present. Aortic valve mean gradient measures 4.0 mmHg. Aortic valve peak gradient measures 9.1 mmHg. Aortic valve area, by VTI measures 3.08 cm. Pulmonic Valve: The pulmonic valve was normal in structure. Pulmonic valve regurgitation is not visualized. No evidence of pulmonic stenosis. Aorta: The aortic root is normal in size and structure. Venous: The inferior vena cava is normal in size with greater than 50% respiratory variability, suggesting right atrial pressure of 3 mmHg. IAS/Shunts: No atrial level shunt detected by color flow Doppler.  LEFT VENTRICLE PLAX 2D LVIDd:         4.80 cm  Diastology LVIDs:         3.10 cm  LV e' medial:    7.18 cm/s LV PW:         1.00 cm  LV E/e' medial:  9.1 LV IVS:        0.90 cm  LV e' lateral:   8.92 cm/s LVOT diam:     2.20 cm  LV E/e' lateral: 7.3 LV SV:         79 LV SV Index:   41 LVOT Area:     3.80 cm  RIGHT VENTRICLE RV Basal diam:  3.30 cm TAPSE (M-mode): 2.0 cm LEFT ATRIUM             Index       RIGHT ATRIUM           Index LA diam:        3.00 cm 1.56 cm/m  RA Area:     14.30 cm LA Vol (A2C):   20.9 ml 10.87 ml/m RA Volume:   38.80 ml  20.17 ml/m LA Vol (A4C):   17.2 ml 8.94 ml/m LA Biplane Vol: 19.7 ml 10.24 ml/m  AORTIC VALVE  PULMONIC VALVE AV  Area (Vmax):    2.72 cm    PV Vmax:       1.19 m/s AV Area (Vmean):   2.99 cm    PV Vmean:      87.100 cm/s AV Area (VTI):     3.08 cm    PV VTI:        0.220 m AV Vmax:           151.00 cm/s PV Peak grad:  5.7 mmHg AV Vmean:          95.600 cm/s PV Mean grad:  3.0 mmHg AV VTI:            0.257 m AV Peak Grad:      9.1 mmHg AV Mean Grad:      4.0 mmHg LVOT Vmax:         108.00 cm/s LVOT Vmean:        75.200 cm/s LVOT VTI:          0.208 m LVOT/AV VTI ratio: 0.81  AORTA Ao Root diam: 3.40 cm MITRAL VALVE MV Area (PHT): 4.21 cm    SHUNTS MV Area VTI:   3.48 cm    Systemic VTI:  0.21 m MV Peak grad:  3.3 mmHg    Systemic Diam: 2.20 cm MV Mean grad:  1.0 mmHg MV Vmax:       0.91 m/s MV Vmean:      53.1 cm/s MV Decel Time: 180 msec MV E velocity: 65.10 cm/s MV A velocity: 83.60 cm/s MV E/A ratio:  0.78 Julien Nordmann MD Electronically signed by Julien Nordmann MD Signature Date/Time: 10/08/2020/12:32:37 PM    Final      Time coordinating discharge: Over 30 minutes    Lewie Chamber, MD  [facility] 10/08/2020, 2:44 PM

## 2020-10-08 NOTE — Progress Notes (Signed)
Neurology Brief Update  MRI brain 1. Acute or early subacute infarct in the right occipital lobe. Associated edema without mass effect. 2. Small remote infarcts in bilateral caudate heads, anterior limb of the right internal capsule, left frontal and right parietal cortex, and right coarona radiata. 3. Mild to moderate chronic microvascular ischemic disease. 4. Moderate right mastoid effusion.  TTE showed no intracardiac clot, no sig abnormalities other than grade I diastolic relaxation impairment  Carotid US  1. Right carotid artery system: Less than 50% stenosis secondary to mild multifocal atherosclerotic plaque formation.  2. Left carotid artery system: Less than 50% stenosis secondary to mild multifocal atherosclerotic plaque formation.  3.  Vertebral artery system: Patent with antegrade flow bilaterally.  Final Recommendations - Stroke w/u complete - Please discharge on ASA 81mg  daily + plavix 75mg  daily x21 days f/b ASA 81mg  daily monotherapy after that - Discharge on atorvastatin 80mg  daily (LDL 210) - I will arrange outpatient f/u with neurology - Pt should work with PCP on aggressive LDL lowering this is a major stroke risk factor for him

## 2020-10-08 NOTE — Progress Notes (Signed)
*  PRELIMINARY RESULTS* Echocardiogram 2D Echocardiogram has been performed.  Lonnie Larson 10/08/2020, 10:22 AM

## 2020-10-08 NOTE — Progress Notes (Signed)
SLP Cancellation Note  Patient Details Name: Lonnie Larson MRN: [id number] DOB: 1960-07-26   Cancelled treatment:       Reason Eval/Treat Not Completed: SLP screened, no needs identified, will sign off (chart reviewed; consulted NSG then met w/ pt) Pt denied any difficulty swallowing and is currently on a regular diet; tolerates swallowing pills w/ water per NSG. Pt conversed at conversational level w/out deficits noted; pt denied any speech-language deficits and stated that the lingual tingling/numbness is resolving. He was insightful into his health and wishes for education on his tests for moving forward. Briefly discussed managing stress and encouraged him to f/u w/ his PCP. Pt has wonderful family support; he is active and independent at home. No further skilled ST services indicated as pt appears at his baseline. Pt agreed. NSG to reconsult if any change in status.      Orinda Kenner, MS, CCC-SLP Speech Language Pathologist Rehab Services [phone] [facility] 10/08/2020, 9:59 AM

## 2020-10-08 NOTE — Discharge Instructions (Signed)
Take aspirin daily. You will only be on Plavix for a total of 21 days until completion.  Lipitor may cause muscle pain/cramping, keep a lookout for this and let your doctor know if this occurs.

## 2020-10-08 NOTE — Plan of Care (Signed)
Pt Aox4, VSS, remains on RA. NIH score 0 overnight. No c/o pain. NSR on telemetry, able to get a few hours of rest overnight. Safety precautions in place, rounding performed, needs/concerns addressed during shift.   Problem: Education: Goal: Knowledge of General Education information will improve Description: Including pain rating scale, medication(s)/side effects and non-pharmacologic comfort measures 10/08/2020 0447 by Michail Sermon, RN Outcome: Progressing 10/07/2020 2037 by Michail Sermon, RN Outcome: Progressing   Problem: Health Behavior/Discharge Planning: Goal: Ability to manage health-related needs will improve 10/08/2020 0447 by Michail Sermon, RN Outcome: Progressing 10/07/2020 2037 by Michail Sermon, RN Outcome: Progressing   Problem: Clinical Measurements: Goal: Ability to maintain clinical measurements within normal limits will improve 10/08/2020 0447 by Michail Sermon, RN Outcome: Progressing 10/07/2020 2037 by Michail Sermon, RN Outcome: Progressing Goal: Will remain free from infection 10/08/2020 0447 by Michail Sermon, RN Outcome: Progressing 10/07/2020 2037 by Michail Sermon, RN Outcome: Progressing Goal: Diagnostic test results will improve 10/08/2020 0447 by Michail Sermon, RN Outcome: Progressing 10/07/2020 2037 by Michail Sermon, RN Outcome: Progressing Goal: Respiratory complications will improve 10/08/2020 0447 by Michail Sermon, RN Outcome: Progressing 10/07/2020 2037 by Michail Sermon, RN Outcome: Progressing Goal: Cardiovascular complication will be avoided 10/08/2020 0447 by Michail Sermon, RN Outcome: Progressing 10/07/2020 2037 by Michail Sermon, RN Outcome: Progressing   Problem: Activity: Goal: Risk for activity intolerance will decrease 10/08/2020 0447 by Michail Sermon, RN Outcome: Progressing 10/07/2020 2037 by Michail Sermon, RN Outcome: Progressing   Problem: Nutrition: Goal: Adequate nutrition will be maintained 10/08/2020  0447 by Michail Sermon, RN Outcome: Progressing 10/07/2020 2037 by Michail Sermon, RN Outcome: Progressing   Problem: Coping: Goal: Level of anxiety will decrease 10/08/2020 0447 by Michail Sermon, RN Outcome: Progressing 10/07/2020 2037 by Michail Sermon, RN Outcome: Progressing   Problem: Elimination: Goal: Will not experience complications related to bowel motility 10/08/2020 0447 by Michail Sermon, RN Outcome: Progressing 10/07/2020 2037 by Michail Sermon, RN Outcome: Progressing Goal: Will not experience complications related to urinary retention 10/08/2020 0447 by Michail Sermon, RN Outcome: Progressing 10/07/2020 2037 by Michail Sermon, RN Outcome: Progressing   Problem: Pain Managment: Goal: General experience of comfort will improve 10/08/2020 0447 by Michail Sermon, RN Outcome: Progressing 10/07/2020 2037 by Michail Sermon, RN Outcome: Progressing   Problem: Safety: Goal: Ability to remain free from injury will improve 10/08/2020 0447 by Michail Sermon, RN Outcome: Progressing 10/07/2020 2037 by Michail Sermon, RN Outcome: Progressing   Problem: Skin Integrity: Goal: Risk for impaired skin integrity will decrease 10/08/2020 0447 by Michail Sermon, RN Outcome: Progressing 10/07/2020 2037 by Michail Sermon, RN Outcome: Progressing

## 2020-10-08 NOTE — Consult Note (Signed)
NEUROLOGY CONSULTATION NOTE   Date of service: Oct 08, 2020 Patient Name: Lonnie Larson MRN:  [id number] DOB:  Apr 10, 1961 Reason for consult: stroke _ _ _   _ __   _ __ _ _  __ __   _ __   __ _  History of Present Illness   Shawnee Higham is a 60 y.o. male with no documented past medical history, supposed to be starting antihypertensive medicine presented to hospital with complaints of left side of the tongue numbness.  Of note patient had gone to his primary care physician for some wax in his ears and then  had left upper extremity numbness including the left face.  That all resolved in 1 hour but then he persisted to have numbness over the tongue for the last 1 week.  He again saw his doctor today and had a CT scan done and was asked to come to the hospital.  Patient denies any focal weakness, nausea, headache, dizziness, blurry vision.  Patient denies any dizziness, lightheadedness or syncope.  Denies any pain, shortness of breath, cough, fever or chills.  Denies any urinary urgency, frequency or dysuria.  Patient has normal appetite and denies any changes in his bowel habits. In the ED, patient any focal neurological deficits.  EKG showed normal sinus rhythm.  CT scan done showed age-indeterminate infarct in the right parietal occipital region potentially early subacute.  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    ROS   10 point review of systems was performed and was negative except as described in HPI.  Past History   Past Medical History:  Diagnosis Date  . Hypertension    History reviewed. No pertinent surgical history. History reviewed. No pertinent family history. Social History   Socioeconomic History  . Marital status: Married    Spouse name: Not on file  . Number of children: Not on file  . Years of education: Not on file  . Highest education level: Not on file  Occupational History  . Not on file   Tobacco Use  . Smoking status: Current Every Day Smoker    Packs/day: 1.00    Types: Cigarettes  . Smokeless tobacco: Never Used  Substance and Sexual Activity  . Alcohol use: Not on file  . Drug use: Not on file  . Sexual activity: Not on file  Other Topics Concern  . Not on file  Social History Narrative  . Not on file   Social Determinants of Health   Financial Resource Strain: Not on file  Food Insecurity: Not on file  Transportation Needs: Not on file  Physical Activity: Not on file  Stress: Not on file  Social Connections: Not on file   No Known Allergies  Medications   No medications prior to admission.     Vitals   Vitals:   10/07/20 1925 10/07/20 2130 10/07/20 2324 10/08/20 0318  BP: (!) 148/85 120/75 117/74 116/76  Pulse: 78 66 68 65  Resp: 18 17 18 16   Temp: 97.8 F (36.6 C) 97.7 F (36.5 C) 98.1 F (36.7 C) 98.7 F (37.1 C)  TempSrc: Oral Oral Oral Oral  SpO2: 99% 100% 97% 96%  Weight:      Height:         Body mass index is 24.96 kg/m.  Physical Exam   Physical Exam Gen: A&O x4, NAD HEENT: Atraumatic, normocephalic;mucous membranes moist; oropharynx clear, tongue without atrophy or fasciculations.  Neck: Supple, trachea midline. Resp: CTAB, no w/r/r CV: RRR, no m/g/r; nml S1 and S2. 2+ symmetric peripheral pulses. Abd: soft/NT/ND; nabs x 4 quad Extrem: Nml bulk; no cyanosis, clubbing, or edema.  Neuro: *MS: A&O x4. Follows multi-step commands.  *Speech: fluid, nondysarthric, able to name and repeat *CN:    I: Deferred   II,III: PERRLA, VFF by confrontation, optic discs sharp   III,IV,VI: EOMI w/o nystagmus, no ptosis   V: Sensation intact from V1 to V3 to LT   VII: Eyelid closure was full.  Smile symmetric.   VIII: Hearing intact to voice   IX,X: Voice normal, palate elevates symmetrically    XI: SCM/trap 5/5 bilat   XII: Tongue protrudes midline, no atrophy or fasciculations   *Motor:   Normal bulk.  No tremor, rigidity or  bradykinesia. No pronator drift.    Strength: Dlt Bic Tri WrE WrF FgS Gr HF KnF KnE PlF DoF    Left 5 5 5 5 5 5 5 5 5 5 5 5     Right 5 5 5 5 5 5 5 5 5 5 5 5     *Sensory: Intact to light touch, pinprick, temperature vibration throughout. Symmetric. Propioception intact bilat.  No double-simultaneous extinction.  *Coordination:  Finger-to-nose, heel-to-shin, rapid alternating motions were intact. *Reflexes:  2+ and symmetric throughout without clonus; toes down-going bilat *Gait: normal base, normal stride, normal turn. Negative Romberg.  NIHSS = 0  Premorbid RS = 1   Labs   CBC:  Recent Labs  Lab 10/07/20 1314 10/08/20 0531  WBC 7.5 6.6  NEUTROABS 4.3  --   HGB 17.5* 16.6  HCT 51.6 48.4  MCV 93.6 94.5  PLT 249 236    Basic Metabolic Panel:  Lab Results  Component Value Date   NA 137 10/07/2020   K 4.0 10/07/2020   CO2 23 10/07/2020   GLUCOSE 101 (H) 10/07/2020   BUN 13 10/07/2020   CREATININE 1.13 10/07/2020   CALCIUM 9.4 10/07/2020   GFRNONAA >60 10/07/2020   Lipid Panel: No results found for: LDLCALC HgbA1c: No results found for: HGBA1C Urine Drug Screen: No results found for: LABOPIA, COCAINSCRNUR, LABBENZ, AMPHETMU, THCU, LABBARB  Alcohol Level No results found for: ETH   Impression   60 yo gentleman presents with LUE and face numbness and L tongue numbness c/f CVA.  Recommendations   - Admit to hospitalist service for stroke w/u; stroke team will consult - Permissive HTN x48 hrs from sx onset or until stroke ruled out by MRI goal BP <220/110. PRN labetalol or hydralazine if BP above these parameters. Avoid oral antihypertensives. - MRI brain wo contrast - Carotid duplex pending - TTE  - Check A1c and LDL + add statin per guidelines - ASA 81mg  daily - q4 hr neuro checks - STAT head CT for any change in neuro exam - Tele - PT/OT/SLP - Stroke education - Amb referral to neurology upon discharge  Will continue to  follow    ______________________________________________________________________   Thank you for the opportunity to take part in the care of this patient. If you have any further questions, please contact the neurology consultation attending.  Signed,  10/09/2020, MD [facility] [phone]  If 7pm- 7am, please page neurology on call as listed in AMION.

## 2022-09-29 LAB — COLOGUARD: COLOGUARD: NEGATIVE

## 2023-01-02 IMAGING — CT CT HEAD W/O CM
3 series · 15 of 47 positions shown, 18 images · non-contrast
Comparison: None.

CLINICAL DATA: Pins and needle sensation down left arm last week.

EXAM:
CT HEAD WITHOUT CONTRAST
TECHNIQUE: Contiguous axial images were obtained from the base of the skull
through the vertex without intravenous contrast.

[Series 3: head wo · axial · 0.40mm/px · z∈[-25,+100]mm · 9 of 30 slices shown, 12 images]
[im 3/30  brain]
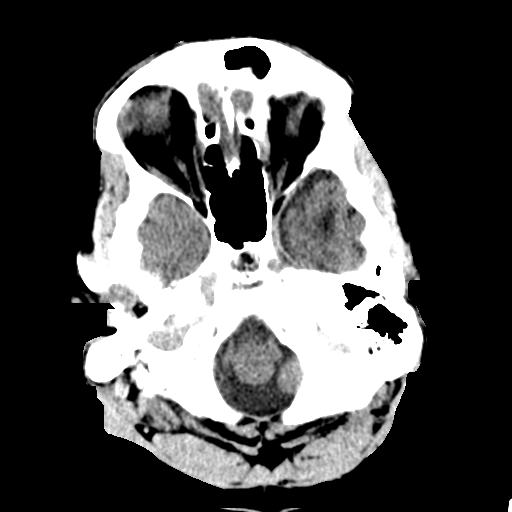
[im 3/30  bone]
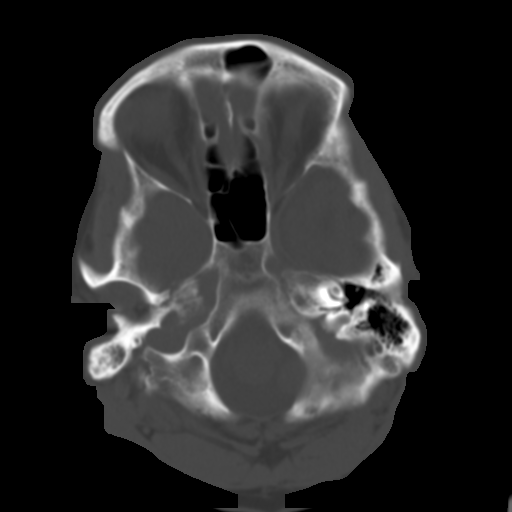
[im 6/30  brain]
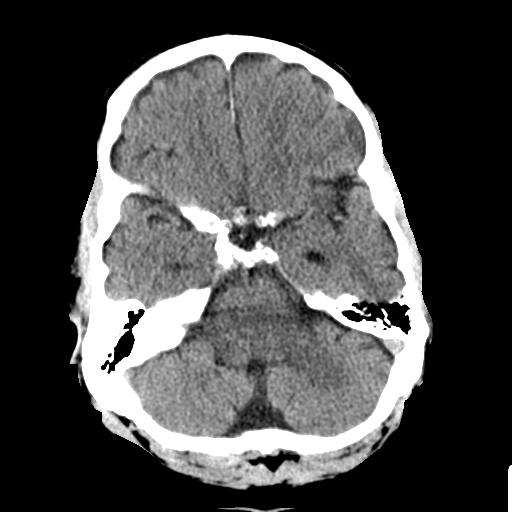
[im 9/30  brain]
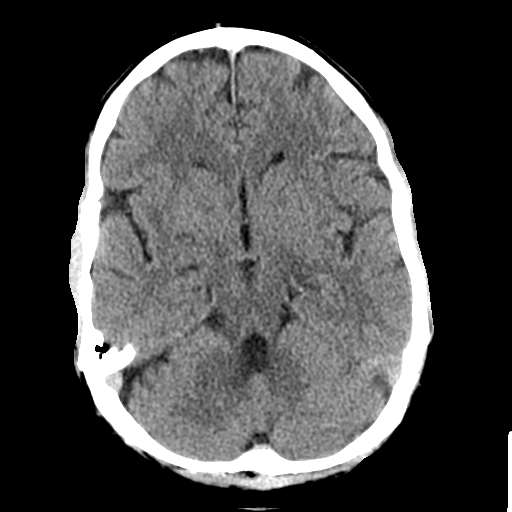
[im 12/30  brain]
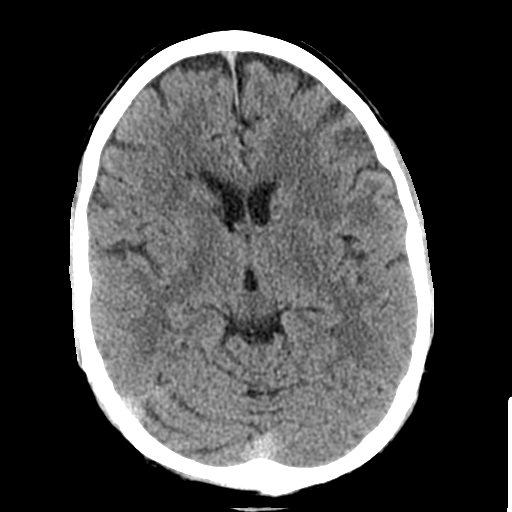
[im 16/30  brain]
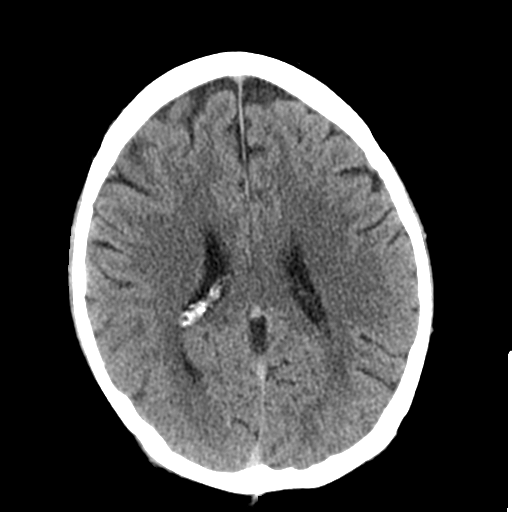
[im 16/30  bone]
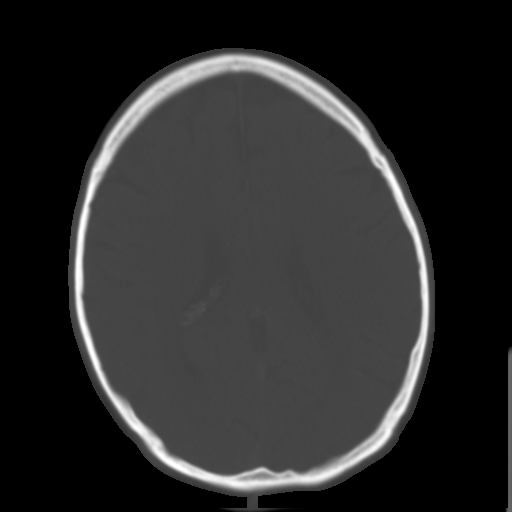
[im 19/30  brain]
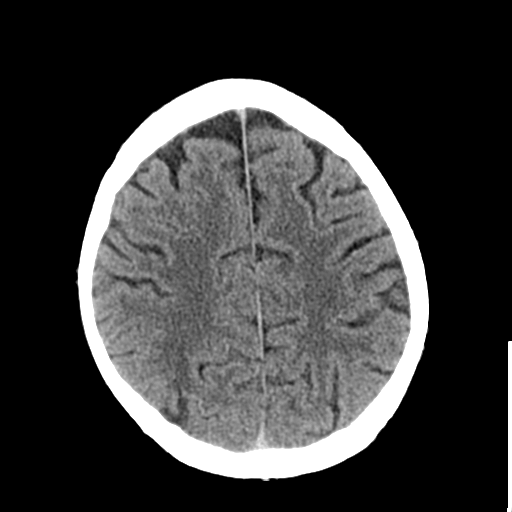
[im 22/30  brain]
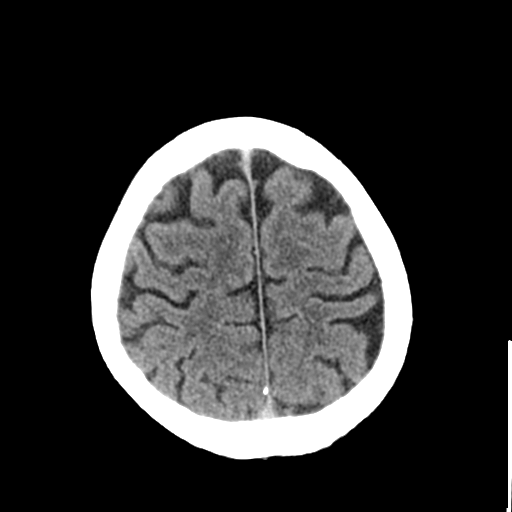
[im 25/30  brain]
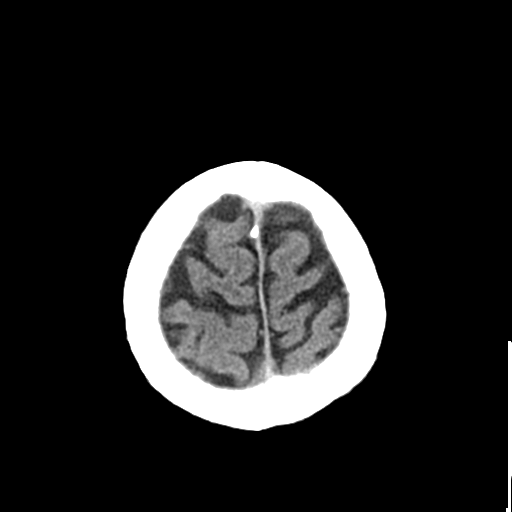
[im 28/30  brain]
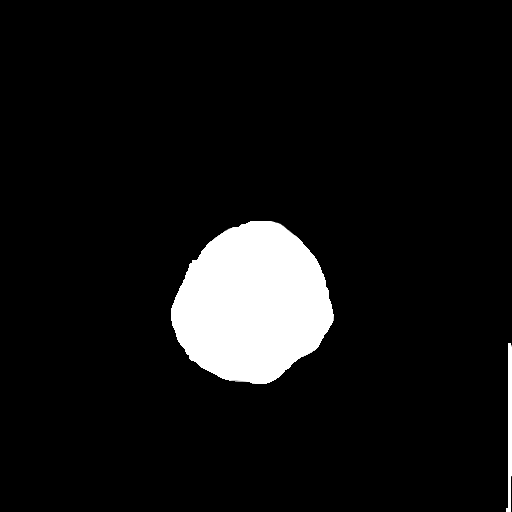
[im 28/30  bone]
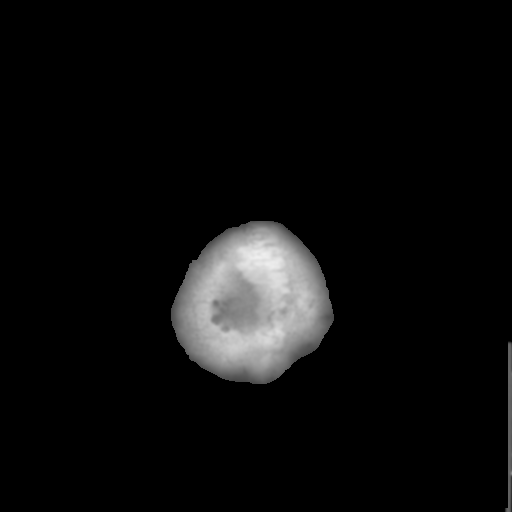

[Series 4: coronal soft tissue · coronal · 0.31mm/px · 3 of 67 slices shown]
[im 23/67  brain]
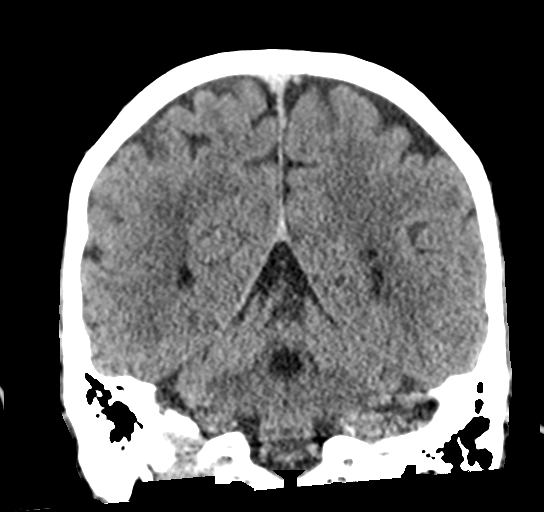
[im 30/67  brain]
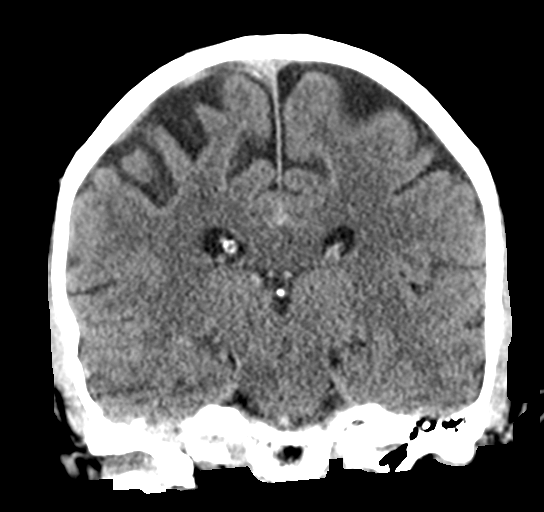
[im 37/67  brain]
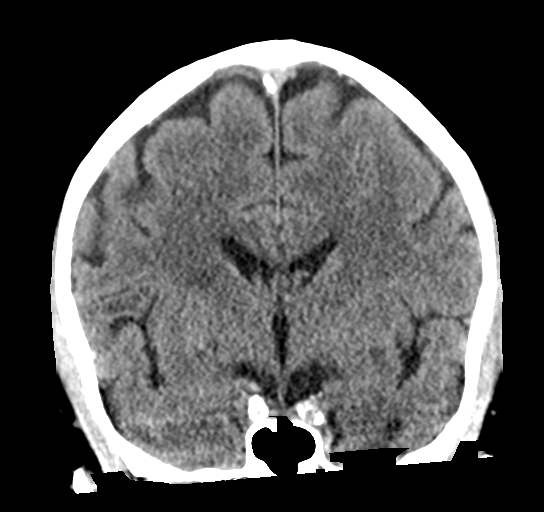

[Series 5: sagittal soft tissue · sagittal · 0.31mm/px · 3 of 58 slices shown]
[im 20/58  brain]
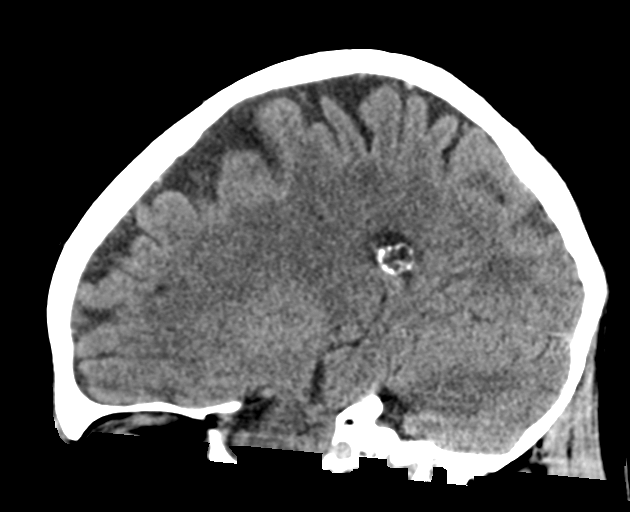
[im 29/58  brain]
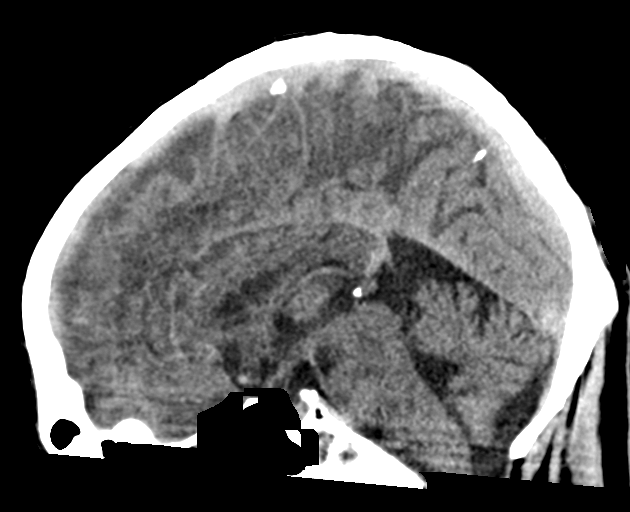
[im 39/58  brain]
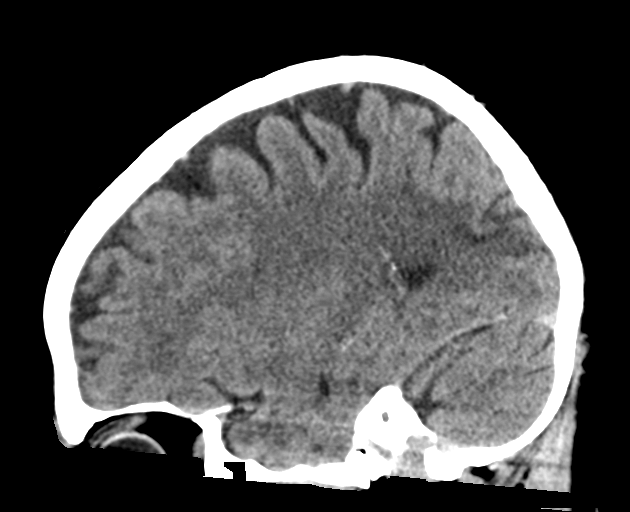

[15 of 47 positions shown; findings below may reference images not displayed]

FINDINGS: Brain: Chest age indeterminate infarct in the right
parieto-occipital region with hypoattenuation and loss of gray-white
differentiation.Remote appearing infarct in the right caudate and
adjacent anterior limb of the internal capsule. No acute hemorrhage.
No hydrocephalus. No midline shift. No visible extra-axial fluid
collection.

Basal cisterns are patent. Additional mild patchy white matter
hypoattenuation, most likely related to chronic microvascular
ischemic disease.

Vascular: No hyperdense vessel identified. Calcific atherosclerosis.

Skull: No acute fracture.

Sinuses/Orbits: Opacified posterior ethmoid air cells, partially
imaged. Otherwise, visualized sinuses are clear.

Other: Small right mastoid effusion.
IMPRESSION: 1. Age indeterminate infarct in the right parieto-occipital region,
potentially early subacute given the patient's reported symptoms. No
substantial mass effect or acute hemorrhage. Recommend MRI to better
evaluate for acute ischemia.
2. Remote appearing infarct in the right caudate and adjacent
anterior limb of the internal capsule.
3. Small right mastoid effusion and opacified posterior ethmoid air
cell.

These results will be called to the ordering clinician or
representative by the Radiologist Assistant, and communication
documented in the PACS or [REDACTED].

## 2023-01-02 IMAGING — US US CAROTID DUPLEX BILAT
1 series · 13 of 24 positions shown · non-contrast
Comparison: None.

CLINICAL DATA: 60-year-old male with stroke.

EXAM:
BILATERAL CAROTID DUPLEX ULTRASOUND
TECHNIQUE: Gray scale imaging, color Doppler and duplex ultrasound were
performed of bilateral carotid and vertebral arteries in the neck.

[Series 1: us carotid bilateral · 13 of 66 slices shown]
[im 1/66]
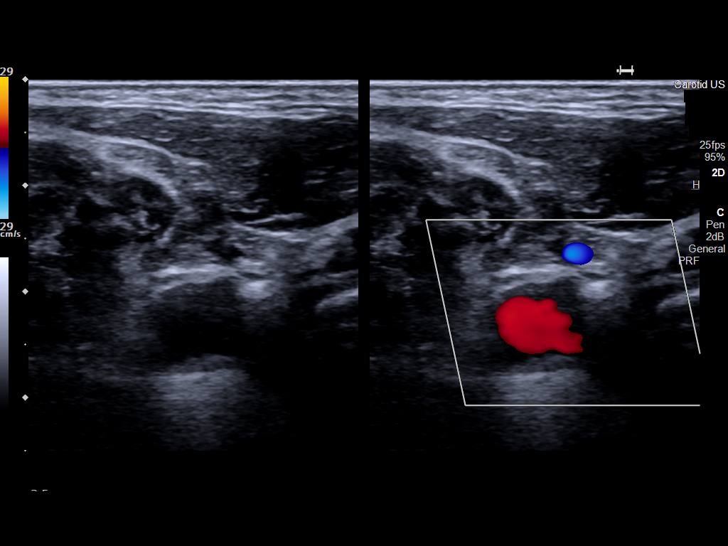
[im 6/66]
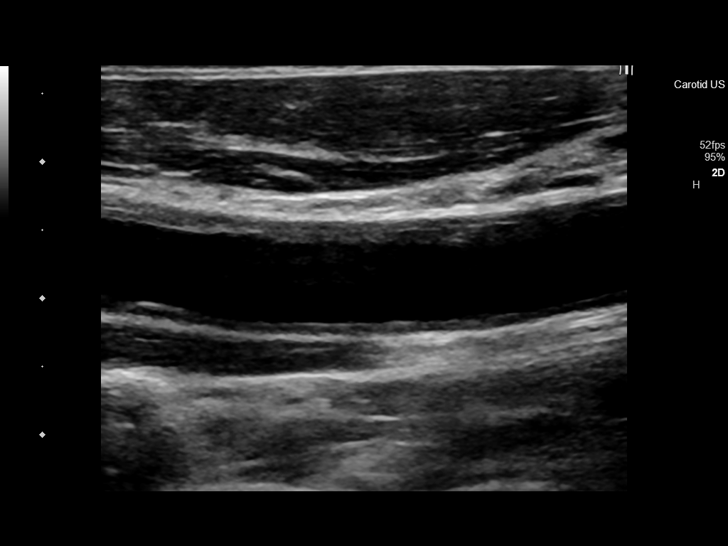
[im 12/66]
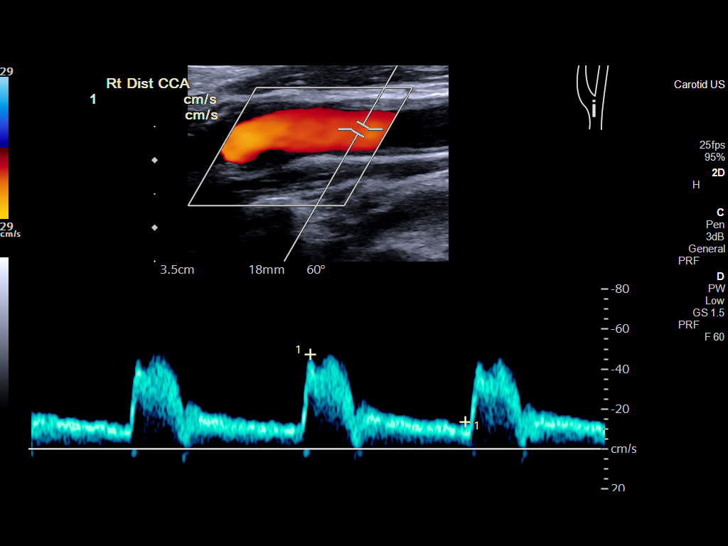
[im 17/66]
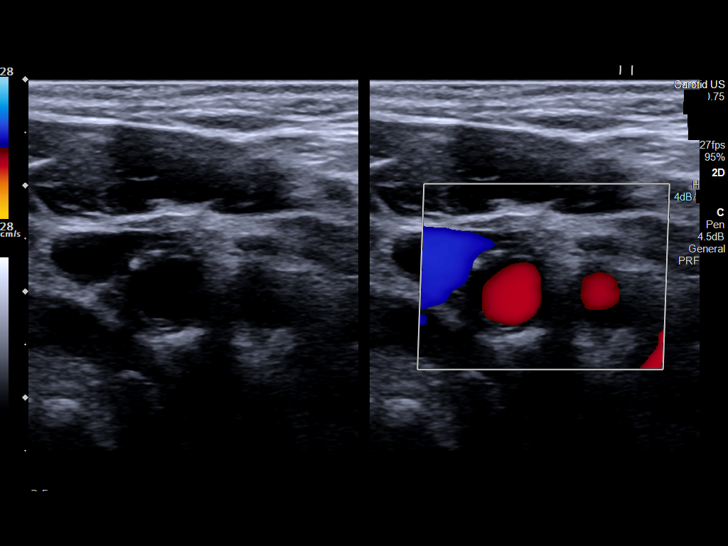
[im 23/66]
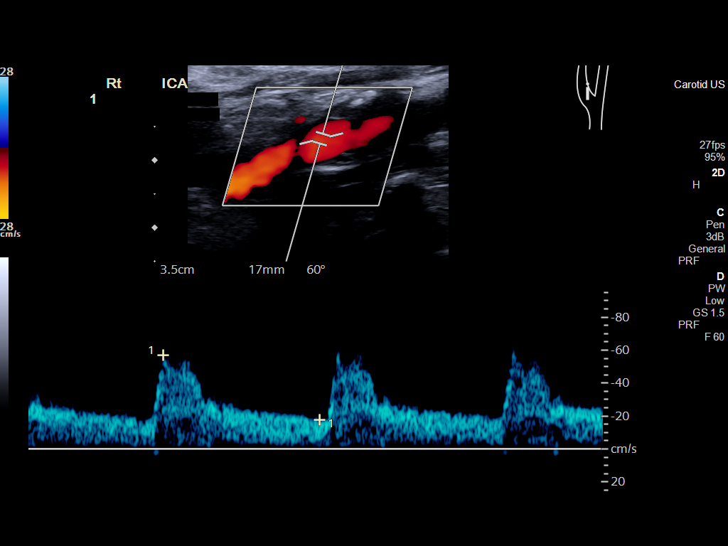
[im 29/66]
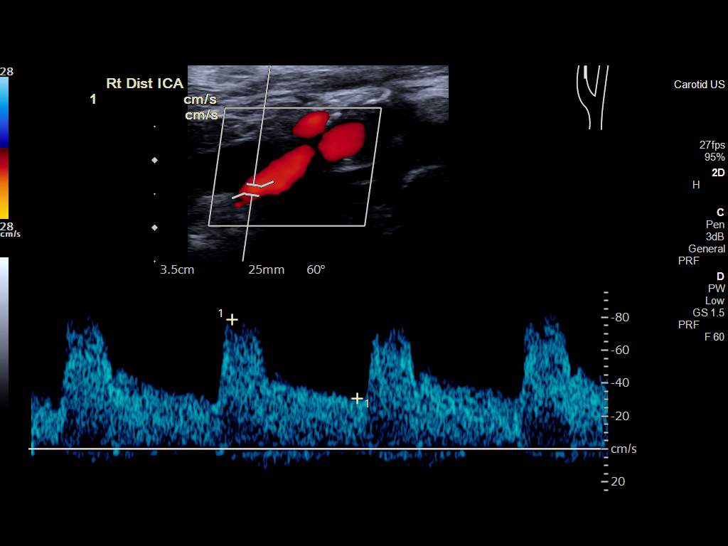
[im 34/66]
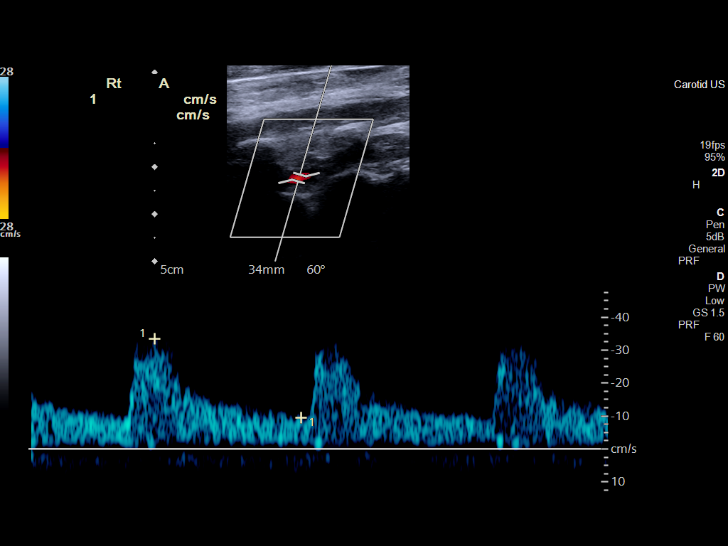
[im 37/66]
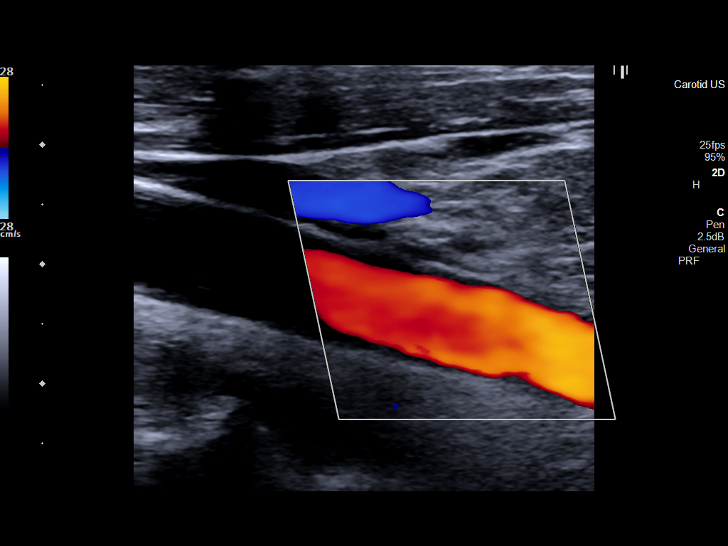
[im 43/66]
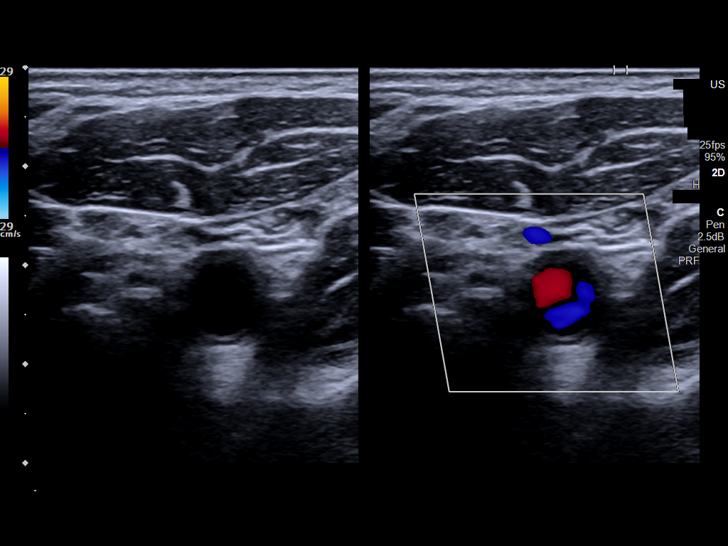
[im 49/66]
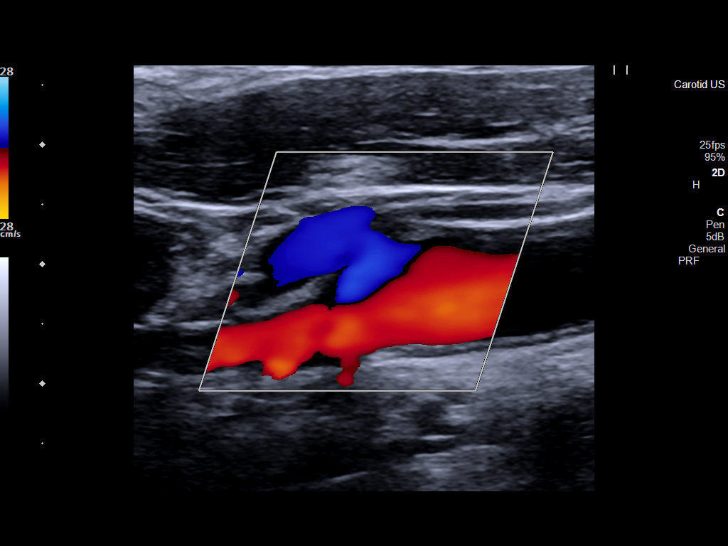
[im 54/66]
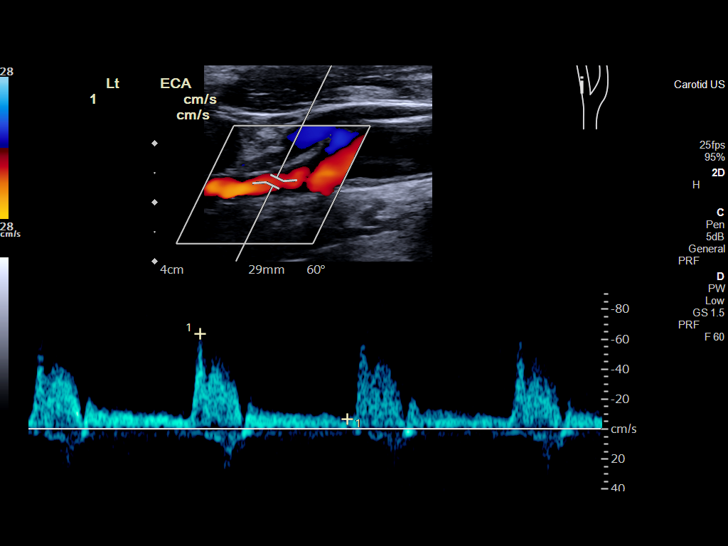
[im 60/66]
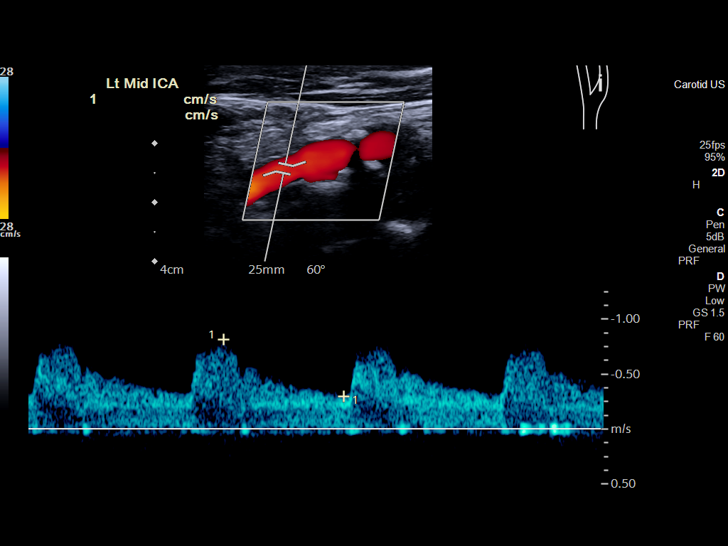
[im 66/66]
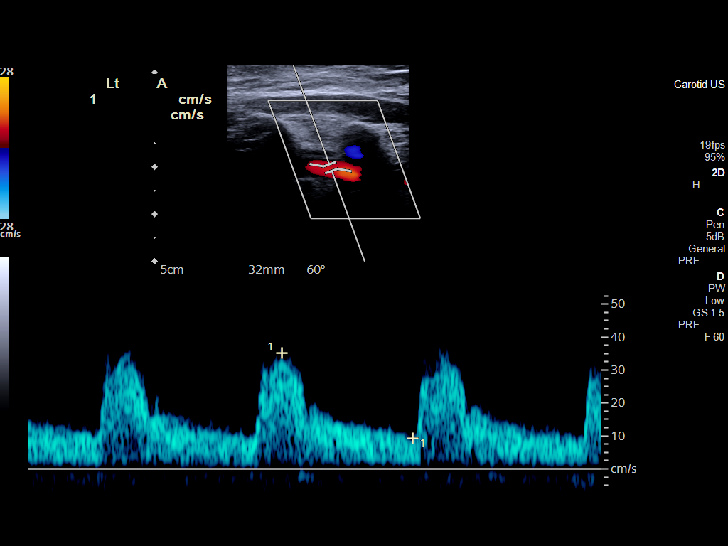

[13 of 24 positions shown; findings below may reference images not displayed]

FINDINGS: Criteria: Quantification of carotid stenosis is based on velocity
parameters that correlate the residual internal carotid diameter
with NASCET-based stenosis levels, using the diameter of the distal
internal carotid lumen as the denominator for stenosis measurement.

The following velocity measurements were obtained:

RIGHT

ICA: Peak systolic velocity 79 cm/sec, End diastolic velocity 31
cm/sec

CCA: Peak systolic velocity 52 cm/sec

SYSTOLIC ICA/CCA RATIO:

ECA: Peak systolic velocity 79 cm/sec

LEFT

ICA: Peak systolic velocity 82 cm/sec, End diastolic velocity 34
cm/sec

CCA: 7 cm/sec

SYSTOLIC ICA/CCA RATIO:

ECA: 64 cm/sec

RIGHT CAROTID ARTERY: Mild multifocal atherosclerotic plaque
formation, most prominent at the carotid bulb and bifurcation. No
significant tortuosity. Normal low resistance waveforms.

RIGHT VERTEBRAL ARTERY:  Antegrade flow.

LEFT CAROTID ARTERY: Mild multifocal atherosclerotic plaque
formation, most prominent in the distal common carotid artery and
carotid bulb. No significant tortuosity. Normal low resistance
waveforms.

LEFT VERTEBRAL ARTERY:  Antegrade flow.

Upper extremity non-invasive blood pressures:

Not obtained.
IMPRESSION: 1. Right carotid artery system: Less than 50% stenosis secondary to
mild multifocal atherosclerotic plaque formation.

2. Left carotid artery system: Less than 50% stenosis secondary to
mild multifocal atherosclerotic plaque formation.

3.  Vertebral artery system: Patent with antegrade flow bilaterally.

## 2023-10-16 ENCOUNTER — Emergency Department

## 2023-10-16 ENCOUNTER — Emergency Department
Admission: EM | Admit: 2023-10-16 | Discharge: 2023-10-17 | Disposition: A | Discharge status: Home / Self Care | Attending: Emergency Medicine | Admitting: Emergency Medicine

## 2023-10-16 ENCOUNTER — Other Ambulatory Visit: Payer: Self-pay

## 2023-10-16 DIAGNOSIS — W01198A Fall on same level from slipping, tripping and stumbling with subsequent striking against other object, initial encounter: Secondary | ICD-10-CM | POA: Diagnosis not present

## 2023-10-16 DIAGNOSIS — S06361A Traumatic hemorrhage of cerebrum, unspecified, with loss of consciousness of 30 minutes or less, initial encounter: Secondary | ICD-10-CM | POA: Insufficient documentation

## 2023-10-16 DIAGNOSIS — S0630AA Unspecified focal traumatic brain injury with loss of consciousness status unknown, initial encounter: Secondary | ICD-10-CM | POA: Diagnosis not present

## 2023-10-16 DIAGNOSIS — S06311A Contusion and laceration of right cerebrum with loss of consciousness of 30 minutes or less, initial encounter: Secondary | ICD-10-CM

## 2023-10-16 DIAGNOSIS — W19XXXA Unspecified fall, initial encounter: Secondary | ICD-10-CM | POA: Diagnosis not present

## 2023-10-16 DIAGNOSIS — S2241XA Multiple fractures of ribs, right side, initial encounter for closed fracture: Secondary | ICD-10-CM

## 2023-10-16 DIAGNOSIS — Y92009 Unspecified place in unspecified non-institutional (private) residence as the place of occurrence of the external cause: Secondary | ICD-10-CM | POA: Diagnosis not present

## 2023-10-16 DIAGNOSIS — S0990XA Unspecified injury of head, initial encounter: Secondary | ICD-10-CM | POA: Diagnosis present

## 2023-10-16 DIAGNOSIS — S0631AA Contusion and laceration of right cerebrum with loss of consciousness status unknown, initial encounter: Secondary | ICD-10-CM

## 2023-10-16 DIAGNOSIS — Z7982 Long term (current) use of aspirin: Secondary | ICD-10-CM | POA: Diagnosis not present

## 2023-10-16 MED ORDER — OXYCODONE HCL 5 MG PO TABS
5.0000 mg | ORAL_TABLET | Freq: Once | ORAL | Status: AC
  Administered 2023-10-16: 5 mg via ORAL
  Filled 2023-10-16: qty 1

## 2023-10-16 MED ORDER — ACETAMINOPHEN 500 MG PO TABS
1000.0000 mg | ORAL_TABLET | Freq: Once | ORAL | Status: AC
  Administered 2023-10-16: 1000 mg via ORAL
  Filled 2023-10-16: qty 2

## 2023-10-16 MED ORDER — LIDOCAINE 5 % EX PTCH
1.0000 | MEDICATED_PATCH | CUTANEOUS | Status: DC
  Administered 2023-10-16: 1 via TRANSDERMAL
  Filled 2023-10-16: qty 1

## 2023-10-16 MED ORDER — LEVETIRACETAM 500 MG PO TABS: 500.0000 mg | ORAL_TABLET | Freq: Two times a day (BID) | ORAL | 0 refills | Status: AC

## 2023-10-16 MED ORDER — LIDOCAINE 5 % EX PTCH: 1.0000 | MEDICATED_PATCH | Freq: Two times a day (BID) | CUTANEOUS | 1 refills | Status: AC

## 2023-10-16 MED ORDER — LEVETIRACETAM 500 MG PO TABS
500.0000 mg | ORAL_TABLET | Freq: Once | ORAL | Status: AC
  Administered 2023-10-16: 500 mg via ORAL
  Filled 2023-10-16: qty 1

## 2023-10-16 MED ORDER — OXYCODONE HCL 5 MG PO TABS: 5.0000 mg | ORAL_TABLET | Freq: Three times a day (TID) | ORAL | 0 refills | Status: AC | PRN

## 2023-10-16 NOTE — ED Notes (Signed)
 Pt to radiology at this time.

## 2023-10-16 NOTE — Discharge Instructions (Addendum)
 As we discussed, small brain bleed from how hard you hit your head.  You are being discharged with a prescription for Keppra antiseizure medicine to take twice daily for the next week to help prevent seizure from irritation of the brain due to this small amount of blood.  2 rib fractures, as we discussed, should heal on their own with pain control  Use Tylenol  for pain and fevers.  Up to 1000 mg per dose, up to 4 times per day.  Do not take more than 4000 mg of Tylenol /acetaminophen  within 24 hours..  Please use lidocaine patches at your site of pain.  Apply 1 patch at a time, leave on for 12 hours, then remove for 12 hours.  12 hours on, 12 hours off.  Do not apply more than 1 patch at a time.  Oxycodone for more severe pain  If your symptoms worsen despite these measures then please return to the ED Intensive Outpatient Programs   [facility] [address] [place],  [place], [place] [phone]  [facility] (Inpatient and outpatient)[phone] (Suboxone and Methadone) [address] [phone]  [facility] - Intensive Outpatient [address] [place], [place] [address]  [phone]  [facility] (Outpatient, Inpatient, [facility] (Groups and Residental) (insurance only) [phone] [place], [place] [phone]   [facility] (for caregivers and family) [address] Amy Kansky [address], [place], [place] [phone]  Residential Treatment Programs  [facility](2 years) Residential: 44 days)[facility] [address] [place], [place], [place] [phone] or  [phone]  [facility] [address] [place], [place], [place] [phone]  [facility] ([facility]) [address] [place], [place], [place] [phone] Admissions: 8am-3pm M-F  [facility] Program: Residential Program [phone] Days)             [facility]: [place]  [facility]  [place], [place], [place]  [phone] or [phone] in Hours over the weekend or by referral)  [facility] [address] [place], [place] [address] [phone] (Do virtual or phone assessment, offer transportation within 25 miles, have in patient and Outpatient options)   [facility]: [facility]:[phone] (for crisis response 24 hours a day)

## 2023-10-16 NOTE — ED Notes (Signed)
 Pt's wife left bedside at this time, she asked to be called w/ pt's disposition when available. Her number is in the chart.

## 2023-10-16 NOTE — ED Provider Notes (Signed)
 [facility] Provider Note    Event Date/Time   First MD Initiated Contact with Patient 10/16/23 2059     (approximate)   History   Fall   HPI  Lonnie Larson is a 63 y.o. male who presents to the ED for evaluation of Fall   Review of routine PCP visit from April.  Aspirin  81 and statin on the medications.  History of stroke  Patient presents with his wife after a fall that occurred this evening at home.  They were relaxing together after neighbors had visited, had 4 or 5 beers, he got up from the couch to go to the grill to turn it on outside when he reportedly tripped while wearing a flip-flop, face planting and hitting his head on hardwood floors.  Wife witnessed this and reports no seizure activity, he was slow to respond but within minutes he was back to baseline, amnestic of these events.  Prior to this no recent illnesses, dizziness or syncope.  He is reporting right sided posterior thoracic back pain, mild headache   Physical Exam   Triage Vital Signs: ED Triage Vitals  Encounter Vitals Group     BP 10/16/23 2002 132/81     Systolic BP Percentile --      Diastolic BP Percentile --      Pulse Rate 10/16/23 2002 83     Resp 10/16/23 2002 18     Temp 10/16/23 2002 98.6 F (37 C)     Temp src --      SpO2 10/16/23 2002 98 %     Weight 10/16/23 2001 160 lb (72.6 kg)     Height 10/16/23 2001 6' (1.829 m)     Head Circumference --      Peak Flow --      Pain Score 10/16/23 2001 5     Pain Loc --      Pain Education --      Exclude from Growth Chart --     Most recent vital signs: Vitals:   10/16/23 2002 10/16/23 2100  BP: 132/81 118/84  Pulse: 83 84  Resp: 18 16  Temp: 98.6 F (37 C)   SpO2: 98% 96%    General: Awake, no distress.  Sitting upright in bed, generally well-appearing and conversational. CV:  Good peripheral perfusion.  Resp:  Normal effort.  Abd:  No distention.  MSK:  No deformity noted.  With localizing  right sided thoracic paraspinal tenderness to palpation to his back.  No bony step-offs or bony tenderness to the spine, throughout Palpation of all 4 extremities without evidence of deformity, tenderness or trauma Neuro:  No focal deficits appreciated. Other:  Right sided periorbital ecchymosis is noted.  Pupils are PERRL without EOM entrapment or proptosis.  No bony step-offs.   ED Results / Procedures / Treatments   Labs (all labs ordered are listed, but only abnormal results are displayed) Labs Reviewed - No data to display  EKG   RADIOLOGY CT head interpreted by me with a small intraparenchymal hemorrhage without midline shift CT maxillofacial interpreted me without evidence of facial bone fractures. 2 view CXR interpreted by me with anterior right sixth rib fracture without pneumothorax Plain from the right shoulder interpreted by me without evidence of fracture or dislocation Plain film of the thoracic spine interpreted by me without evidence of fracture or dislocation  Official radiology report(s): DG Chest 2 View Result Date: 10/16/2023 CLINICAL DATA:  Right posterior thoracic spine pain  after syncopal episode with a fall. EXAM: CHEST - 2 VIEW COMPARISON:  None Available. FINDINGS: Normal heart size and pulmonary vascularity. No focal airspace disease or consolidation in the lungs. No blunting of costophrenic angles. No pneumothorax. Mediastinal contours appear intact. Irregularity is demonstrated in the anterior right sixth and seventh ribs possibly acute nondisplaced fractures. Correlate with physical examination. Calcification of the aorta. IMPRESSION: Probable nondisplaced right anterior sixth and seventh rib fractures. No evidence of active pulmonary disease. Electronically Signed   By: Boyce Byes M.D.   On: 10/16/2023 22:34   DG Thoracic Spine 2 View Result Date: 10/16/2023 CLINICAL DATA:  Right posterior Thoracic spine pain after a fall. Syncopal episode. EXAM: THORACIC  SPINE 2 VIEWS COMPARISON:  None Available. FINDINGS: Normal alignment of the thoracic spine. No vertebral compression deformities. No focal bone lesion or bone destruction. Degenerative changes with disc space narrowing and endplate osteophyte formation throughout. No paraspinal soft tissue infiltration or swelling. Calcification of the aorta. IMPRESSION: Normal alignment of the thoracic spine. No acute displaced fractures identified. Degenerative changes. Electronically Signed   By: Boyce Byes M.D.   On: 10/16/2023 22:33   CT Maxillofacial Wo Contrast Result Date: 10/16/2023 CLINICAL DATA:  Fall with bruising on right side of face. EXAM: CT MAXILLOFACIAL WITHOUT CONTRAST TECHNIQUE: Multidetector CT imaging of the maxillofacial structures was performed. Multiplanar CT image reconstructions were also generated. RADIATION DOSE REDUCTION: This exam was performed according to the departmental dose-optimization program which includes automated exposure control, adjustment of the mA and/or kV according to patient size and/or use of iterative reconstruction technique. COMPARISON:  None available. FINDINGS: Osseous: No acute fracture of the nasal bones, zygomatic arches or mandibles. The temporomandibular joints are congruent. Intact maxilla. There are scattered dental caries. Orbits: No acute orbital fracture.  No evidence of globe injury. Sinuses: No sinus fracture or hemosinus. Scattered mucosal thickening throughout the paranasal sinuses. Soft tissues: Soft tissue thickening and edema in the right side of the face. Limited intracranial: Assessed on concurrent head CT, reported separately. IMPRESSION: Soft tissue thickening and edema in the right side of the face. No acute facial bone fracture. Electronically Signed   By: Chadwick Colonel M.D.   On: 10/16/2023 20:56   CT HEAD WO CONTRAST ( ) Result Date: 10/16/2023 CLINICAL DATA:  Fall today after drinking alcohol. EXAM: CT HEAD WITHOUT CONTRAST TECHNIQUE:  Contiguous axial images were obtained from the base of the skull through the vertex without intravenous contrast. RADIATION DOSE REDUCTION: This exam was performed according to the departmental dose-optimization program which includes automated exposure control, adjustment of the mA and/or kV according to patient size and/or use of iterative reconstruction technique. COMPARISON:  Head CT 10/07/2020 FINDINGS: Brain: 5 mm hyperdensity in the right temporal occipital lobe, series 2, image 12, not seen on prior exam and likely small parenchymal hemorrhage. There is no associated mass effect. Remote right parietooccipital and anterior left temporal infarct. Remote lacunar infarcts in the caudate and right basal ganglia. No evidence of acute ischemia. Normal ventricular size, no hydrocephalus. Vascular: No hyperdense vessel or unexpected calcification. Skull: No skull fracture. Sinuses/Orbits: Assessed on concurrent face CT, reported separately. There is chronic but progressive opacification of right mastoid air cells. Other: Traumatic Brain Injury Risk Stratification Skull Fracture: No - Low/mBIG 1 Subdural Hematoma (SDH): No - Low Subarachnoid Hemorrhage [facility]): No Epidural Hematoma (EDH): No - Low/mBIG 1 Cerebral contusion, intra-axial, intraparenchymal Hemorrhage (IPH): solitary, 4 to <55mm - Moderate/mBIG 2 Intraventricular Hemorrhage (IVH): No - Low/mBIG 1 Midline  Shift > 1mm or Edema/effacement of sulci/vents: No - Low/mBIG 1 ---------------------------------------------------- IMPRESSION: 1. Small 5 mm hyperdensity in the right temporal occipital lobe, not seen on prior exam and likely small parenchymal hemorrhage. No associated mass effect. 2. 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 Remote lacunar infarcts in the caudate and right basal ganglia. 3. Chronic but progressive opacification of right mastoid air cells. Critical Value/emergent results were called by telephone at the time of  interpretation on 10/16/2023 at 8:52 pm to provider Ray, who verbally acknowledged these results. Electronically Signed   By: Chadwick Colonel M.D.   On: 10/16/2023 20:54   DG Shoulder Right Result Date: 10/16/2023 CLINICAL DATA:  Fall with shoulder pain EXAM: RIGHT SHOULDER - 2+ VIEW COMPARISON:  None Available. FINDINGS: There is no evidence of fracture or dislocation. There is no evidence of arthropathy or other focal bone abnormality. Soft tissues are unremarkable. IMPRESSION: Negative. Electronically Signed   By: Tyron Gallon M.D.   On: 10/16/2023 20:42    PROCEDURES and INTERVENTIONS:  .1-3 Lead EKG Interpretation  Performed by: Arline Bennett, MD Authorized by: Arline Bennett, MD     Interpretation: normal     ECG rate:  80   ECG rate assessment: normal     Rhythm: sinus rhythm     Ectopy: none     Conduction: normal   .Critical Care  Performed by: Arline Bennett, MD Authorized by: Arline Bennett, MD   Critical care provider statement:    Critical care time (minutes):  30   Critical care time was exclusive of:  Separately billable procedures and treating other patients   Critical care was necessary to treat or prevent imminent or life-threatening deterioration of the following conditions:  Trauma and CNS failure or compromise   Critical care was time spent personally by me on the following activities:  Development of treatment plan with patient or surrogate, discussions with consultants, evaluation of patient's response to treatment, examination of patient, ordering and review of laboratory studies, ordering and review of radiographic studies, ordering and performing treatments and interventions, pulse oximetry, re-evaluation of patient's condition and review of old charts   Medications  lidocaine (LIDODERM) 5 % 1 patch (1 patch Transdermal Patch Applied 10/16/23 2130)  levETIRAcetam (KEPPRA) tablet 500 mg (500 mg Oral Given 10/16/23 2129)  acetaminophen  (TYLENOL ) tablet 1,000 mg (1,000 mg  Oral Given 10/16/23 2130)  oxyCODONE (Oxy IR/ROXICODONE) immediate release tablet 5 mg (5 mg Oral Given 10/16/23 2129)     IMPRESSION / MDM / ASSESSMENT AND PLAN / ED COURSE  I reviewed the triage vital signs and the nursing notes.  Differential diagnosis includes, but is not limited to, traumatic ICH, spontaneous aneurysmal bleeding, spinal fracture, pneumothorax, seizure, cardiac dysrhythmia  {Patient presents with symptoms of an acute illness or injury that is potentially life-threatening.  Patient presents after what sounds like mechanical fall with a small traumatic intraparenchymal hemorrhage.  GCS 15, neurologically intact and look systemically well.  CT, as above.  No preceding illnesses, seizure activity, though he is amnestic of the events.  Thankfully witnessed by the wife, who is here, no signs of mechanical fall with him tripping on some flip-flops that he was wearing.  I consulted neurosurgery, 1 week of Keppra due to the location of the small hemorrhage.  Repeat CT. patient signed out to oncoming physician.  If bleed is stable on imaging, likely suitable for outpatient management.  Clinical Course as of 10/16/23 2253  Tue Oct 16, 2023  2111 I consulted neurosurgery, 1 week of Keppra, repeat CT after 4 to 6 hours [DS]  2253 Reassessed.  Discussed rib fractures, plan of care. [DS]    Clinical Course User Index [DS] Arline Bennett, MD     FINAL CLINICAL IMPRESSION(S) / ED DIAGNOSES   Final diagnoses:  Intraparenchymal hematoma of right side of brain due to trauma, with loss of consciousness of 30 minutes or less, initial encounter (HCC)  Fall, initial encounter  Closed fracture of multiple ribs of right side, initial encounter     Rx / DC Orders   ED Discharge Orders          Ordered    oxyCODONE (ROXICODONE) 5 MG immediate release tablet  Every 8 hours PRN        10/16/23 2250    lidocaine (LIDODERM) 5 %  Every 12 hours        10/16/23 2251    levETIRAcetam  (KEPPRA) 500 MG tablet  2 times daily        10/16/23 2251             Note:  This document was prepared using Dragon voice recognition software and may include unintentional dictation errors.   Arline Bennett, MD 10/16/23 [phone]

## 2023-10-16 NOTE — ED Notes (Signed)
 ED Provider at bedside.

## 2023-10-16 NOTE — ED Triage Notes (Signed)
 Pt reports fall today after drinking alcohol, pt c/o pain to right shoulder. Pt has bruising noted to right side of face. Pt reports hx stroke but it not on blood thinners.

## 2023-10-17 ENCOUNTER — Emergency Department

## 2023-10-17 DIAGNOSIS — S0630AA Unspecified focal traumatic brain injury with loss of consciousness status unknown, initial encounter: Secondary | ICD-10-CM

## 2023-10-17 DIAGNOSIS — W19XXXA Unspecified fall, initial encounter: Secondary | ICD-10-CM

## 2023-10-17 MED ORDER — OXYCODONE HCL 5 MG PO TABS
5.0000 mg | ORAL_TABLET | Freq: Once | ORAL | Status: AC
  Administered 2023-10-17: 5 mg via ORAL
  Filled 2023-10-17: qty 1

## 2023-10-17 MED ORDER — THIAMINE MONONITRATE 100 MG PO TABS
100.0000 mg | ORAL_TABLET | Freq: Every day | ORAL | Status: DC
  Filled 2023-10-17: qty 1

## 2023-10-17 MED ORDER — LORAZEPAM 2 MG/ML IJ SOLN: 1.0000 mg | INTRAMUSCULAR | Status: DC | PRN

## 2023-10-17 MED ORDER — THIAMINE HCL 100 MG/ML IJ SOLN
100.0000 mg | Freq: Every day | INTRAMUSCULAR | Status: DC
Start: 2023-10-17 — End: 2023-10-17

## 2023-10-17 MED ORDER — FOLIC ACID 1 MG PO TABS
1.0000 mg | ORAL_TABLET | Freq: Every day | ORAL | Status: DC
  Filled 2023-10-17: qty 1

## 2023-10-17 MED ORDER — ADULT MULTIVITAMIN W/MINERALS CH
1.0000 | ORAL_TABLET | Freq: Every day | ORAL | Status: DC
  Filled 2023-10-17: qty 1

## 2023-10-17 MED ORDER — OXYCODONE HCL 5 MG PO TABS
5.0000 mg | ORAL_TABLET | Freq: Four times a day (QID) | ORAL | Status: DC | PRN
  Administered 2023-10-17: 5 mg via ORAL
  Filled 2023-10-17: qty 1

## 2023-10-17 MED ORDER — LORAZEPAM 1 MG PO TABS: 1.0000 mg | ORAL_TABLET | ORAL | Status: DC | PRN

## 2023-10-17 NOTE — ED Notes (Signed)
 Pt complaining of back pain. Dr Alejo Amsler notified and aware and has ordered PRN oxycodone.

## 2023-10-17 NOTE — ED Provider Notes (Signed)
 Emergency department handoff note  Care of this patient was signed out to me at the end of the previous provider shift.  All pertinent patient information was conveyed and all questions were answered.  Patient pending repeat head CT that has shown no progression of the subdural bleeding.  I have spoken to Dr. Henderson Lock in neurosurgery who agrees the patient is stable for discharge at this time and return to clinic for recheck.  Also recommends 7 days of Keppra twice daily which has been prescribed prior to my shift. The patient has been reexamined and is ready to be discharged.  All diagnostic results have been reviewed and discussed with the patient/family.  Care plan has been outlined and the patient/family understands all current diagnoses, results, and treatment plans.  There are no new complaints, changes, or physical findings at this time.  All questions have been addressed and answered.  Patient was instructed to, and agrees to follow-up with their primary care physician as well as return to the emergency department if any new or worsening symptoms develop.   Merrissa Giacobbe K, MD 10/17/23 [phone]

## 2023-10-17 NOTE — ED Provider Notes (Addendum)
  Reviewed CT scan which shows increase in the subdural now to 7 mm with no midline shift and a unchanged 5 mm intraparenchymal bleed.  I reassessed the patient at this time his vital signs remained stable and he is awake alert answering "all questions appropriately.  He has bruising around the right eye.  He is complaining of upper back/trapezius pain and so I ordered another oxycodone which was effective some hours ago.  I reconsulted with Dr. Henderson Lock of neurosurgery given the increased SDH on repeat imaging.  Disposition pending recommendations from neurosurgery.  ---  I talked with Dr. Henderson Lock who reviewed the CT imaging, would like another repeat 6-hour at 9 AM.  He will come see the patient in the morning for disposition planning.  PROCEDURES:  Critical Care performed: Yes, see critical care procedure note(s)  .Critical Care  Performed by: Buell Carmin, MD Authorized by: Buell Carmin, MD   Critical care provider statement:    Critical care time (minutes):  30   Critical care was time spent personally by me on the following activities:  Development of treatment plan with patient or surrogate, discussions with consultants, evaluation of patient's response to treatment, examination of patient, ordering and review of laboratory studies, ordering and review of radiographic studies, ordering and performing treatments and interventions, pulse oximetry, re-evaluation of patient's condition and review of old charts       Buell Carmin, MD 10/17/23 0301    Buell Carmin, MD 10/17/23 [phone]

## 2023-10-17 NOTE — Consult Note (Signed)
 Consulting Department:  Emergency department  Primary Physician:  Rudolpho Norleen BIRCH, MD  Chief Complaint: Fall  History of Present Illness: 10/17/2023 Lonnie Larson is a 63 y.o. male who presents with the chief complaint of fall with head trauma.  Neurosurgery consulted for traumatic intraparenchymal hemorrhage.  Patient has not had any seizures.  He was inebriated when he fell.  Tripped over his flip-flops striking his face and head.  Unsure whether or not he lost consciousness.  No seizures.  No fluid leak.  No new weakness numbness or tingling.  No neck pain.  No new neurologic symptoms.   Review of Systems:  A 10 point review of systems is negative, except for the pertinent positives and negatives detailed in the HPI.  Past Medical History: Past Medical History:  Diagnosis Date   Hypertension     Past Surgical History: History reviewed. No pertinent surgical history.  Allergies: Allergies as of 10/16/2023   (No Known Allergies)    Medications:  Current Facility-Administered Medications:    folic acid  (FOLVITE ) tablet 1 mg, 1 mg, Oral, Daily, Cyrena Mylar, MD   lidocaine  (LIDODERM ) 5 % 1 patch, 1 patch, Transdermal, Q24H, Claudene Rover, MD, 1 patch at 10/16/23 2130   LORazepam  (ATIVAN ) tablet 1-4 mg, 1-4 mg, Oral, Q1H PRN **OR** LORazepam  (ATIVAN ) injection 1-4 mg, 1-4 mg, Intravenous, Q1H PRN, Cyrena Mylar, MD   multivitamin with minerals tablet 1 tablet, 1 tablet, Oral, Daily, Cyrena Mylar, MD   oxyCODONE  (Oxy IR/ROXICODONE ) immediate release tablet 5 mg, 5 mg, Oral, Q6H PRN, Bradler, Evan K, MD, 5 mg at 10/17/23 0736   thiamine  (VITAMIN B1) tablet 100 mg, 100 mg, Oral, Daily **OR** thiamine  (VITAMIN B1) injection 100 mg, 100 mg, Intravenous, Daily, Cyrena Mylar, MD  Current Outpatient Medications:    levETIRAcetam  (KEPPRA ) 500 MG tablet, Take 1 tablet (500 mg total) by mouth 2 (two) times daily for 7 days., Disp: 14 tablet, Rfl: 0   lidocaine  (LIDODERM ) 5 %, Place 1 patch  onto the skin every 12 (twelve) hours. Remove & Discard patch within 12 hours or as directed by MD, Disp: 10 patch, Rfl: 1   oxyCODONE  (ROXICODONE ) 5 MG immediate release tablet, Take 1 tablet (5 mg total) by mouth every 8 (eight) hours as needed., Disp: 10 tablet, Rfl: 0   aspirin  EC 81 MG EC tablet, Take 1 tablet (81 mg total) by mouth daily. Swallow whole., Disp: 30 tablet, Rfl: 11   atorvastatin  (LIPITOR) 80 MG tablet, Take 1 tablet (80 mg total) by mouth daily., Disp: 30 tablet, Rfl: 11   clopidogrel  (PLAVIX ) 75 MG tablet, Take 1 tablet (75 mg total) by mouth daily., Disp: 20 tablet, Rfl: 0   Social History: Social History   Tobacco Use   Smoking status: Every Day    Current packs/day: 1.00    Types: Cigarettes   Smokeless tobacco: Never    Family Medical History: History reviewed. No pertinent family history.  Physical Examination: Vitals:   10/17/23 0700 10/17/23 0719  BP: 132/84 (!) 148/86  Pulse: 72 70  Resp: 18   Temp:    SpO2: 94%      General: Patient is well developed, well nourished, calm, collected, and in no apparent distress.  NEUROLOGICAL:  General: In no acute distress.   Awake, alert, oriented to person, place, and time.  Pupils equal round and reactive to light.  Face shows some asymmetry secondary to swelling and facial trauma.  GCS: 15  No evidence of pronator drift on  outstretched arms  Imaging: CT HEAD WO CONTRAST ( ) Result Date: 10/17/2023 CLINICAL DATA:  repeat to eval stabilty of iph; fall, eval fx EXAM: CT HEAD WITHOUT CONTRAST CT CERVICAL SPINE WITHOUT CONTRAST TECHNIQUE: Multidetector CT imaging of the head and cervical spine was performed following the standard protocol without intravenous contrast. Multiplanar CT image reconstructions of the cervical spine were also generated. RADIATION DOSE REDUCTION: This exam was performed according to the departmental dose-optimization program which includes automated exposure control, adjustment of  the mA and/or kV according to patient size and/or use of iterative reconstruction technique. COMPARISON:  CT head June 3, 25. FINDINGS: CT HEAD FINDINGS Brain: Interval increase in size of the acute hyperdense left cerebral convexity subdural hemorrhage, now measuring 7 mm in thickness. Similar 5 mm intraparenchymal hemorrhage in the posterior right temporal lobe. No midline shift. 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 Similar remote lacunar infarcts in the caudate and right basal ganglia. No evidence of acute large vascular territory infarct, mass lesion, or hydrocephalus. Vascular: Calcific atherosclerosis. Skull: No acute fracture. Sinuses/Orbits: Mild paranasal sinus mucosal thickening. CT CERVICAL SPINE FINDINGS Alignment: No substantial sagittal subluxation. Skull base and vertebrae: Bony remodeling of the C7 superior endplate, likely degenerative. No convincing evidence of acute fracture. Soft tissues and spinal canal: No prevertebral fluid or swelling. No visible canal hematoma. Disc levels: C6-C7 degenerative disc disease with bony remodeling of the C7 superior endplate. Upper chest: Visualized lung apices are clear. IMPRESSION: CT head: 1. Interval increase in size of the acute left subdural hemorrhage, now 7 mm. No midline shift. 2. Unchanged 5 mm posterior right temporal intraparenchymal hemorrhage. 3. Unchanged remote infarcts and chronic microvascular ischemic disease. Cervical spine: 1. No evidence of acute fracture or traumatic malalignment. 2. C6-C7 degenerative disc disease with bony remodeling of the C7 superior endplate. Findings discussed with Dr. Cyrena via telephone at 2:50 a.m. Electronically Signed   By: Gilmore GORMAN Molt M.D.   On: 10/17/2023 02:51   CT Cervical Spine Wo Contrast Result Date: 10/17/2023 CLINICAL DATA:  repeat to eval stabilty of iph; fall, eval fx EXAM: CT HEAD WITHOUT CONTRAST CT CERVICAL SPINE WITHOUT CONTRAST TECHNIQUE:  Multidetector CT imaging of the head and cervical spine was performed following the standard protocol without intravenous contrast. Multiplanar CT image reconstructions of the cervical spine were also generated. RADIATION DOSE REDUCTION: This exam was performed according to the departmental dose-optimization program which includes automated exposure control, adjustment of the mA and/or kV according to patient size and/or use of iterative reconstruction technique. COMPARISON:  CT head June 3, 25. FINDINGS: CT HEAD FINDINGS Brain: Interval increase in size of the acute hyperdense left cerebral convexity subdural hemorrhage, now measuring 7 mm in thickness. Similar 5 mm intraparenchymal hemorrhage in the posterior right temporal lobe. No midline shift. 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 Similar remote lacunar infarcts in the caudate and right basal ganglia. No evidence of acute large vascular territory infarct, mass lesion, or hydrocephalus. Vascular: Calcific atherosclerosis. Skull: No acute fracture. Sinuses/Orbits: Mild paranasal sinus mucosal thickening. CT CERVICAL SPINE FINDINGS Alignment: No substantial sagittal subluxation. Skull base and vertebrae: Bony remodeling of the C7 superior endplate, likely degenerative. No convincing evidence of acute fracture. Soft tissues and spinal canal: No prevertebral fluid or swelling. No visible canal hematoma. Disc levels: C6-C7 degenerative disc disease with bony remodeling of the C7 superior endplate. Upper chest: Visualized lung apices are clear. IMPRESSION: CT head: 1. Interval increase in size of the  acute left subdural hemorrhage, now 7 mm. No midline shift. 2. Unchanged 5 mm posterior right temporal intraparenchymal hemorrhage. 3. Unchanged remote infarcts and chronic microvascular ischemic disease. Cervical spine: 1. No evidence of acute fracture or traumatic malalignment. 2. C6-C7 degenerative disc disease with bony  remodeling of the C7 superior endplate. Findings discussed with Dr. Cyrena via telephone at 2:50 a.m. Electronically Signed   By: Gilmore GORMAN Molt M.D.   On: 10/17/2023 02:51   DG Chest 2 View Result Date: 10/16/2023 CLINICAL DATA:  Right posterior thoracic spine pain after syncopal episode with a fall. EXAM: CHEST - 2 VIEW COMPARISON:  None Available. FINDINGS: Normal heart size and pulmonary vascularity. No focal airspace disease or consolidation in the lungs. No blunting of costophrenic angles. No pneumothorax. Mediastinal contours appear intact. Irregularity is demonstrated in the anterior right sixth and seventh ribs possibly acute nondisplaced fractures. Correlate with physical examination. Calcification of the aorta. IMPRESSION: Probable nondisplaced right anterior sixth and seventh rib fractures. No evidence of active pulmonary disease. Electronically Signed   By: Elsie Gravely M.D.   On: 10/16/2023 22:34   DG Thoracic Spine 2 View Result Date: 10/16/2023 CLINICAL DATA:  Right posterior Thoracic spine pain after a fall. Syncopal episode. EXAM: THORACIC SPINE 2 VIEWS COMPARISON:  None Available. FINDINGS: Normal alignment of the thoracic spine. No vertebral compression deformities. No focal bone lesion or bone destruction. Degenerative changes with disc space narrowing and endplate osteophyte formation throughout. No paraspinal soft tissue infiltration or swelling. Calcification of the aorta. IMPRESSION: Normal alignment of the thoracic spine. No acute displaced fractures identified. Degenerative changes. Electronically Signed   By: Elsie Gravely M.D.   On: 10/16/2023 22:33   CT Maxillofacial Wo Contrast Result Date: 10/16/2023 CLINICAL DATA:  Fall with bruising on right side of face. EXAM: CT MAXILLOFACIAL WITHOUT CONTRAST TECHNIQUE: Multidetector CT imaging of the maxillofacial structures was performed. Multiplanar CT image reconstructions were also generated. RADIATION DOSE REDUCTION: This exam  was performed according to the departmental dose-optimization program which includes automated exposure control, adjustment of the mA and/or kV according to patient size and/or use of iterative reconstruction technique. COMPARISON:  None available. FINDINGS: Osseous: No acute fracture of the nasal bones, zygomatic arches or mandibles. The temporomandibular joints are congruent. Intact maxilla. There are scattered dental caries. Orbits: No acute orbital fracture.  No evidence of globe injury. Sinuses: No sinus fracture or hemosinus. Scattered mucosal thickening throughout the paranasal sinuses. Soft tissues: Soft tissue thickening and edema in the right side of the face. Limited intracranial: Assessed on concurrent head CT, reported separately. IMPRESSION: Soft tissue thickening and edema in the right side of the face. No acute facial bone fracture. Electronically Signed   By: Andrea Gasman M.D.   On: 10/16/2023 20:56   CT HEAD WO CONTRAST ( ) Result Date: 10/16/2023 CLINICAL DATA:  Fall today after drinking alcohol. EXAM: CT HEAD WITHOUT CONTRAST TECHNIQUE: Contiguous axial images were obtained from the base of the skull through the vertex without intravenous contrast. RADIATION DOSE REDUCTION: This exam was performed according to the departmental dose-optimization program which includes automated exposure control, adjustment of the mA and/or kV according to patient size and/or use of iterative reconstruction technique. COMPARISON:  Head CT 10/07/2020 FINDINGS: Brain: 5 mm hyperdensity in the right temporal occipital lobe, series 2, image 12, not seen on prior exam and likely small parenchymal hemorrhage. There is no associated mass effect. Remote right parietooccipital and anterior left temporal infarct. Remote lacunar infarcts in the caudate and  right basal ganglia. No evidence of acute ischemia. Normal ventricular size, no hydrocephalus. Vascular: No hyperdense vessel or unexpected calcification. Skull: No  skull fracture. Sinuses/Orbits: Assessed on concurrent face CT, reported separately. There is chronic but progressive opacification of right mastoid air cells. Other: Traumatic Brain Injury Risk Stratification Skull Fracture: No - Low/mBIG 1 Subdural Hematoma (SDH): No - Low Subarachnoid Hemorrhage [facility]): No Epidural Hematoma (EDH): No - Low/mBIG 1 Cerebral contusion, intra-axial, intraparenchymal Hemorrhage (IPH): solitary, 4 to <42mm - Moderate/mBIG 2 Intraventricular Hemorrhage (IVH): No - Low/mBIG 1 Midline Shift > 1mm or Edema/effacement of sulci/vents: No - Low/mBIG 1 ---------------------------------------------------- IMPRESSION: 1. Small 5 mm hyperdensity in the right temporal occipital lobe, not seen on prior exam and likely small parenchymal hemorrhage. No associated mass effect. 2. 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 Remote lacunar infarcts in the caudate and right basal ganglia. 3. Chronic but progressive opacification of right mastoid air cells. Critical Value/emergent results were called by telephone at the time of interpretation on 10/16/2023 at 8:52 pm to provider Ray, who verbally acknowledged these results. Electronically Signed   By: Andrea Gasman M.D.   On: 10/16/2023 20:54   DG Shoulder Right Result Date: 10/16/2023 CLINICAL DATA:  Fall with shoulder pain EXAM: RIGHT SHOULDER - 2+ VIEW COMPARISON:  None Available. FINDINGS: There is no evidence of fracture or dislocation. There is no evidence of arthropathy or other focal bone abnormality. Soft tissues are unremarkable. IMPRESSION: Negative. Electronically Signed   By: Greig Pique M.D.   On: 10/16/2023 20:42     I have personally reviewed the images and agree with the above interpretation.  Labs:    Latest Ref Rng & Units 10/08/2020    5:31 AM 10/07/2020    1:14 PM  CBC  WBC 4.0 - 10.5 K/uL 6.6  7.5   Hemoglobin 13.0 - 17.0 g/dL 83.3  82.4   Hematocrit 39.0 - 52.0 % 48.4  51.6   Platelets 150 -  400 K/uL 236  249       Latest Ref Rng & Units 10/08/2020    5:31 AM 10/07/2020    1:14 PM  BMP  Glucose 70 - 99 mg/dL 89  898   BUN 8 - 23 mg/dL 15  13   Creatinine 9.38 - 1.24 mg/dL 9.08  8.86   Sodium 864 - 145 mmol/L 137  137   Potassium 3.5 - 5.1 mmol/L 3.8  4.0   Chloride 98 - 111 mmol/L 103  103   CO2 22 - 32 mmol/L 25  23   Calcium  8.9 - 10.3 mg/dL 9.0  9.4         Assessment and Plan: Mr. Deeney is a pleasant 63 y.o. male with recent head trauma, he fell and struck his head suffering a intraparenchymal hemorrhage in the temporal lobe and traumatic subarachnoid hemorrhage.  Unsure whether or not he lost consciousness.  He was inebriated.  Does not have any new neurologic deficits.  Has a mild headache.  Has not had any seizures.  No CSF leak.  On physical exam is nonfocal no evidence pronator drift.  GCS 15.  CT scan demonstrates a small temporal region intraparenchymal hemorrhage with subarachnoid hemorrhage.  At this point given the temporal location, he is at risk for seizures, would initiate 1 week of seizure prophylaxis.  I did discuss TBI symptoms with the patient, we will plan to follow-up with repeat head CT, if this is stable then he can be discharged and  follow-up in clinic with repeat head CT 2 to 4 weeks.  Currently a nonsurgical lesion.    Penne MICAEL Sharps, MD/MSCR Dept. of Neurosurgery

## 2023-10-17 NOTE — ED Notes (Addendum)
 Dr Alejo Amsler messaged through secure chat by this RN: "pts family was asking if its okay to continue taking his prescribed baby aspirin ?"  Dr Alejo Amsler responded: "no. no aspirin , ibuprofen, or naproxen until follow up with NSG"  This message was relayed to pt and family members by this RN

## 2023-10-17 NOTE — ED Notes (Signed)
 ED Provider at bedside.

## 2023-10-17 NOTE — ED Notes (Signed)
 Pt returned from radiology and c/o right upper back pain, denies head pain. Margery Sheets, MD notified.

## 2023-10-17 NOTE — ED Notes (Signed)
 Patient transported to CT

## 2023-10-17 NOTE — ED Notes (Signed)
 Pt updated on plan of care and verbalized understanding, pt denies needs at this time. Call light within reach.

## 2023-10-17 NOTE — TOC Progression Note (Signed)
 Transition of Care [facility]) - Progression Note    Patient Details  Name: Lonnie Larson MRN: [id number] Date of Birth: Sep 28, 1960  [facility] of [facility]) CM/SW Contact  Arminda Landmark, RN Phone Number: 10/17/2023, 8:41 AM  Clinical Narrative:    [facility] consulted for SA. SA resources added to AVS.         Expected Discharge Plan and Services                                               Social Determinants of Health (SDOH) Interventions SDOH Screenings   Housing: Unknown (08/15/2023)   Received from [facility]  Tobacco Use: High Risk (10/16/2023)    Readmission Risk Interventions     No data to display

## 2023-10-29 ENCOUNTER — Telehealth: Payer: Self-pay | Admitting: Neurosurgery

## 2023-10-29 NOTE — Telephone Encounter (Signed)
 Patient was seen in the ER on 10/17/23. Patient and wife are both on the phone. They have a couple of questions. No dizziness. Should he still f/u in 1 month? He is having increased headaches that come and go. Is this normal to have headaches every day? He takes a tylenol  and that seems to ease the pain some. Is there anything stronger that he can take?

## 2023-10-29 NOTE — Telephone Encounter (Signed)
 Will forward to Dr Felipe Horton to answer these questions.

## 2023-10-29 NOTE — Telephone Encounter (Signed)
 Scheduled for a telephone visit tomorrow (10/30/23) at 3pm. They would like you to call the wife's # Sherleen Dirks) at [phone]. I advised them to go to the ER if symptoms worsen.

## 2023-10-30 ENCOUNTER — Ambulatory Visit: Admitting: Neurosurgery

## 2023-10-30 DIAGNOSIS — W19XXXD Unspecified fall, subsequent encounter: Secondary | ICD-10-CM

## 2023-10-30 DIAGNOSIS — S069XAD Unspecified intracranial injury with loss of consciousness status unknown, subsequent encounter: Secondary | ICD-10-CM | POA: Diagnosis not present

## 2023-10-30 DIAGNOSIS — S069X0D Unspecified intracranial injury without loss of consciousness, subsequent encounter: Secondary | ICD-10-CM

## 2023-10-30 NOTE — Progress Notes (Signed)
 I had a follow-up phone call with Mr. Wettstein today.  He was at home and I was in the office.  Gave consent to go forward with a phone visit.  I spoke with him and his spouse.  We discussed postconcussive/post TBI symptoms including headache, nausea, lack of interest, depression, mood swings, insomnia among others.  He is experiencing almost all of these symptoms.  He is also experiencing some headache.  I did let them know that her initial plan was to have them follow-up with repeat head CT but he would like to hold off on the head CT for a few more weeks if possible.  I did let them know if his symptoms are continuing to worsen that we should check to make sure that the hematoma was not expanding.  I would like to see them in clinic after the CT scan and offered to see them before if needed.  I spent a total of 10 minutes on the phone discussing his symptoms.  I placed a order for a CT scan that can be done whenever they are ready.  Carroll Clamp, MD

## 2023-11-07 DIAGNOSIS — S0631AA Contusion and laceration of right cerebrum with loss of consciousness status unknown, initial encounter: Secondary | ICD-10-CM

## 2023-11-30 ENCOUNTER — Ambulatory Visit
Admission: RE | Admit: 2023-11-30 | Discharge: 2023-11-30 | Disposition: A | Discharge status: Home / Self Care | Source: Ambulatory Visit | Attending: Neurosurgery | Admitting: Neurosurgery

## 2023-11-30 DIAGNOSIS — S069X0D Unspecified intracranial injury without loss of consciousness, subsequent encounter: Secondary | ICD-10-CM | POA: Diagnosis present

## 2023-12-05 ENCOUNTER — Ambulatory Visit: Payer: Self-pay | Admitting: Neurosurgery
